# Patient Record
Sex: Male | Born: 1995 | Race: Black or African American | Hispanic: No | Marital: Single | Smoking: Never smoker
Health system: Southern US, Community
[De-identification: ages and names within clinical notes are randomized; demographics above are authoritative.]

## PROBLEM LIST (undated history)

## (undated) DIAGNOSIS — L309 Dermatitis, unspecified: Secondary | ICD-10-CM

## (undated) DIAGNOSIS — T7840XA Allergy, unspecified, initial encounter: Secondary | ICD-10-CM

## (undated) HISTORY — PX: OTHER SURGICAL HISTORY: SHX169

## (undated) HISTORY — DX: Dermatitis, unspecified: L30.9

## (undated) HISTORY — DX: Allergy, unspecified, initial encounter: T78.40XA

---
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
## 2017-01-29 ENCOUNTER — Ambulatory Visit: Payer: Self-pay | Admitting: Family Medicine

## 2017-06-24 ENCOUNTER — Encounter: Payer: Self-pay | Admitting: Emergency Medicine

## 2017-06-24 ENCOUNTER — Emergency Department
Admission: EM | Admit: 2017-06-24 | Discharge: 2017-06-24 | Disposition: A | Discharge status: Home / Self Care | Payer: Self-pay | Attending: Emergency Medicine | Admitting: Emergency Medicine

## 2017-06-24 ENCOUNTER — Other Ambulatory Visit: Payer: Self-pay

## 2017-06-24 DIAGNOSIS — Z79899 Other long term (current) drug therapy: Secondary | ICD-10-CM | POA: Insufficient documentation

## 2017-06-24 DIAGNOSIS — B9789 Other viral agents as the cause of diseases classified elsewhere: Secondary | ICD-10-CM | POA: Insufficient documentation

## 2017-06-24 DIAGNOSIS — J069 Acute upper respiratory infection, unspecified: Secondary | ICD-10-CM | POA: Insufficient documentation

## 2017-06-24 LAB — INFLUENZA PANEL BY PCR (TYPE A & B)
INFLBPCR: NEGATIVE
Influenza A By PCR: NEGATIVE

## 2017-06-24 MED ORDER — PROMETHAZINE-DM 6.25-15 MG/5ML PO SYRP: 5.0000 mL | ORAL_SOLUTION | Freq: Four times a day (QID) | ORAL | 0 refills | Status: DC | PRN

## 2017-06-24 NOTE — Discharge Instructions (Signed)
Advised Tylenol or ibuprofen for fever/body aches.

## 2017-06-24 NOTE — ED Notes (Signed)
Pt states "I think I have the flu" c/o cough, congestion, HA, N/V and fever since Sunday. States he has been taking vick's cold and flu to treat sx.

## 2017-06-24 NOTE — ED Provider Notes (Signed)
[facility] Emergency Department Provider Note   ____________________________________________   First MD Initiated Contact with Patient 06/24/17 1041     (approximate)  I have reviewed the triage vital signs and the nursing notes.   HISTORY  Chief Complaint Generalized Body Aches    HPI Mitchell Michael is a 22 y.o. male patient complain of body aches, fever, and cough for 3 days.  Patient state that was vomiting yesterday none today.  Patient has not taken a flu shot for this season.  Patient rates his pain discomfort is 8/10.  Patient describes his pain is "aching".  No palates measured for complaint.  Past Medical History:  Diagnosis Date  . Allergy   . Eczema     Patient Active Problem List   Diagnosis Date Noted  . History of anemia 08/25/2015  . Allergy   . Eczema     Past Surgical History:  Procedure Laterality Date  . cyst removed Left     Prior to Admission medications   Medication Sig Start Date End Date Taking? Authorizing Provider  fluticasone (FLONASE) 50 MCG/ACT nasal spray Place 2 sprays into both nostrils daily. 06/29/16   Menshew, Charlesetta Ivory, PA-C  naproxen (NAPROSYN) 500 MG tablet Take 1 tablet (500 mg total) by mouth 2 (two) times daily with a meal. 07/31/16   Particia Nearing, PA-C  promethazine-dextromethorphan (PROMETHAZINE-DM) 6.25-15 MG/5ML syrup Take 5 mLs by mouth 4 (four) times daily as needed for cough. 06/24/17   Joni Reining, PA-C    Allergies Patient has no known allergies.  Family History  Problem Relation Age of Onset  . Hypertension Mother   . Hypertension Sister   . Hypertension Maternal Grandmother   . Heart murmur Maternal Grandmother   . Diabetes Maternal Grandfather   . Hypertension Maternal Grandfather   . Thyroid disease Maternal Grandfather   . Cancer Maternal Grandfather        Brain Tumor    Social History Social History   Tobacco Use  . Smoking status: Never Smoker  .  Smokeless tobacco: Never Used  Substance Use Topics  . Alcohol use: No  . Drug use: No    Review of Systems Constitutional: No fever/chills Eyes: No visual changes. ENT: No sore throat.  Nasal congestion and runny nose. Cardiovascular: Denies chest pain. Respiratory: Denies shortness of breath. Gastrointestinal: No abdominal pain.  Resolved nausea and vomiting. no diarrhea.  No constipation. Genitourinary: Negative for dysuria. Musculoskeletal: Negative for back pain. Skin: Negative for rash. Neurological: Negative for headaches, focal weakness or numbness.   ____________________________________________   PHYSICAL EXAM:  VITAL SIGNS: ED Triage Vitals  Enc Vitals Group     BP 06/24/17 0947 (!) 126/96     Pulse Rate 06/24/17 0947 67     Resp 06/24/17 0947 20     Temp 06/24/17 0947 98.9 F (37.2 C)     Temp Source 06/24/17 0947 Oral     SpO2 06/24/17 0947 99 %     Weight 06/24/17 0949 175 lb (79.4 kg)     Height 06/24/17 0949 5\' 7"  (1.702 m)     Head Circumference --      Peak Flow --      Pain Score 06/24/17 0953 8     Pain Loc --      Pain Edu? --      Excl. in GC? --    Constitutional: Alert and oriented. Well appearing and in no acute distress. Nose: Edematous  nasal turbinates clear rhinorrhea. mouth/Throat: Mucous membranes are moist.  Oropharynx non-erythematous.  Nasal drainage Neck: No stridor.  Cardiovascular: Normal rate, regular rhythm. Grossly normal heart sounds.  Good peripheral circulation. Respiratory: Normal respiratory effort.  No retractions. Lungs CTAB. Gastrointestinal: Soft and nontender. No distention. No abdominal bruits. No CVA tenderness. Musculoskeletal: No lower extremity tenderness nor edema.  No joint effusions. Neurologic:  Normal speech and language. No gross focal neurologic deficits are appreciated. No gait instability. Skin:  Skin is warm, dry and intact. No rash noted. Psychiatric: Mood and affect are normal. Speech and behavior  are normal.  ____________________________________________   LABS (all labs ordered are listed, but only abnormal results are displayed)  Labs Reviewed  INFLUENZA PANEL BY PCR (TYPE A & B)   ____________________________________________  EKG   ____________________________________________  RADIOLOGY  ED MD interpretation:    Official radiology report(s): No results found.  ____________________________________________   PROCEDURES  Procedure(s) performed: None  Procedures  Critical Care performed: No  ____________________________________________   INITIAL IMPRESSION / ASSESSMENT AND PLAN / ED COURSE  As part of my medical decision making, I reviewed the following data within the electronic MEDICAL RECORD NUMBER   Viral respiratory infection.  Discussed negative flu results with patient.  Patient given discharge care instruction advised take medication as directed.  [facility]      ____________________________________________   FINAL CLINICAL IMPRESSION(S) / ED DIAGNOSES  Final diagnoses:  Viral upper respiratory tract infection     ED Discharge Orders        Ordered    promethazine-dextromethorphan (PROMETHAZINE-DM) 6.25-15 MG/5ML syrup  4 times daily PRN     06/24/17 1108       Note:  This document was prepared using Dragon voice recognition software and may include unintentional dictation errors.    Joni ReiningSmith, Ronald K, PA-C 06/24/17 1113    Emily FilbertWilliams, Jonathan E, MD 06/24/17 [phone]

## 2017-06-24 NOTE — ED Triage Notes (Signed)
Presents with body aches ,fever and cough since Sunday pm

## 2017-12-04 ENCOUNTER — Emergency Department
Admission: EM | Admit: 2017-12-04 | Discharge: 2017-12-04 | Disposition: A | Discharge status: Home / Self Care | Payer: Self-pay | Attending: Emergency Medicine | Admitting: Emergency Medicine

## 2017-12-04 ENCOUNTER — Other Ambulatory Visit: Payer: Self-pay

## 2017-12-04 DIAGNOSIS — B9789 Other viral agents as the cause of diseases classified elsewhere: Secondary | ICD-10-CM | POA: Insufficient documentation

## 2017-12-04 DIAGNOSIS — J069 Acute upper respiratory infection, unspecified: Secondary | ICD-10-CM | POA: Insufficient documentation

## 2017-12-04 DIAGNOSIS — A084 Viral intestinal infection, unspecified: Secondary | ICD-10-CM | POA: Insufficient documentation

## 2017-12-04 MED ORDER — PSEUDOEPH-BROMPHEN-DM 30-2-10 MG/5ML PO SYRP: 5.0000 mL | ORAL_SOLUTION | Freq: Four times a day (QID) | ORAL | 0 refills | Status: DC | PRN

## 2017-12-04 MED ORDER — ONDANSETRON 4 MG PO TBDP: 4.0000 mg | ORAL_TABLET | Freq: Three times a day (TID) | ORAL | 0 refills | Status: DC | PRN

## 2017-12-04 NOTE — ED Provider Notes (Signed)
[facility] Emergency Department Provider Note  ____________________________________________   First MD Initiated Contact with Patient 12/04/17 1357     (approximate)  I have reviewed the triage vital signs and the nursing notes.   HISTORY  Chief Complaint Cough and Sore Throat  HPI Mitchell Michael is a 22 y.o. male is here with complaint of cough, sore throat and headache for 1 week.  Patient states that his temperature was 99.8 at home.  He denies any knowledge of exposure to strep throat.  No one else in the family currently is sick.  Patient also complains of vomiting and diarrhea.  This just recently started.  He rates his pain as an 8 out of 10.   Past Medical History:  Diagnosis Date  . Allergy   . Eczema     Patient Active Problem List   Diagnosis Date Noted  . History of anemia 08/25/2015  . Allergy   . Eczema     Past Surgical History:  Procedure Laterality Date  . cyst removed Left     Prior to Admission medications   Medication Sig Start Date End Date Taking? Authorizing Provider  brompheniramine-pseudoephedrine-DM 30-2-10 MG/5ML syrup Take 5 mLs by mouth 4 (four) times daily as needed. 12/04/17   Tommi Rumps, PA-C  ondansetron (ZOFRAN ODT) 4 MG disintegrating tablet Take 1 tablet (4 mg total) by mouth every 8 (eight) hours as needed for nausea or vomiting. 12/04/17   Tommi Rumps, PA-C    Allergies Patient has no known allergies.  Family History  Problem Relation Age of Onset  . Hypertension Mother   . Hypertension Sister   . Hypertension Maternal Grandmother   . Heart murmur Maternal Grandmother   . Diabetes Maternal Grandfather   . Hypertension Maternal Grandfather   . Thyroid disease Maternal Grandfather   . Cancer Maternal Grandfather        Brain Tumor    Social History Social History   Tobacco Use  . Smoking status: Never Smoker  . Smokeless tobacco: Never Used  Substance Use Topics  . Alcohol use: No   . Drug use: No    Review of Systems Constitutional: Positive fever/chills Eyes: No visual changes. ENT: Positive sore throat. Cardiovascular: Denies chest pain. Respiratory: Denies shortness of breath.  Positive for nonproductive cough. Gastrointestinal: No abdominal pain.  Positive nausea, give vomiting.  Positive diarrhea.   Musculoskeletal: Negative for muscle aches. Skin: Negative for rash. Neurological: Negative for headaches, focal weakness or numbness. ____________________________________________   PHYSICAL EXAM:  VITAL SIGNS: ED Triage Vitals  Enc Vitals Group     BP 12/04/17 1329 114/77     Pulse Rate 12/04/17 1329 66     Resp 12/04/17 1328 16     Temp 12/04/17 1328 99.1 F (37.3 C)     Temp Source 12/04/17 1328 Oral     SpO2 12/04/17 1329 100 %     Weight 12/04/17 1329 165 lb (74.8 kg)     Height 12/04/17 1329 5\' 7"  (1.702 m)     Head Circumference --      Peak Flow --      Pain Score 12/04/17 1328 8     Pain Loc --      Pain Edu? --      Excl. in GC? --    Constitutional: Alert and oriented. Well appearing and in no acute distress.  Currently patient is lying on the stretcher eating a bag of white cheddar popcorn and  drinking a drink.  He does not appear to be in any acute distress. Eyes: Conjunctivae are normal.  Head: Atraumatic. Nose: No congestion/rhinnorhea.  TMs are dull bilaterally but no erythema or injection is noted. Mouth/Throat: Mucous membranes are moist.  Oropharynx non-erythematous.  Mild posterior drainage noted.  No exudate and uvula is midline. Neck: No stridor.   Hematological/Lymphatic/Immunilogical: No cervical lymphadenopathy. Cardiovascular: Normal rate, regular rhythm. Grossly normal heart sounds.  Good peripheral circulation. Respiratory: Normal respiratory effort.  No retractions. Lungs CTAB. Gastrointestinal: Soft and nontender. No distention. Musculoskeletal: Moves upper and lower extremities without any difficulty.  Normal gait  was noted. Neurologic:  Normal speech and language. No gross focal neurologic deficits are appreciated. No gait instability. Skin:  Skin is warm, dry and intact. No rash noted. Psychiatric: Mood and affect are normal. Speech and behavior are normal.  ____________________________________________   LABS (all labs ordered are listed, but only abnormal results are displayed)  Labs Reviewed - No data to display   PROCEDURES  Procedure(s) performed: None  Procedures  Critical Care performed: No  ____________________________________________   INITIAL IMPRESSION / ASSESSMENT AND PLAN / ED COURSE  As part of my medical decision making, I reviewed the following data within the electronic MEDICAL RECORD NUMBER Notes from prior ED visits and [place] Controlled Substance Database  Patient was given prescription for Bromfed-DM as needed for cough and congestion.  He was instructed to refrain from eating food for the next 24 hours and stay on clear liquids.  The popcorn that he ate while in the ED most likely will result in more diarrhea which he is aware of.  He was given a prescription for Zofran ODT 4 mg 1 every 8 hours as needed for nausea.  Tylenol if needed for fever.  Mother was made aware that this is contagious as we have been seeing many people with it.  He will follow-up with his PCP if any continued problems.   ____________________________________________   FINAL CLINICAL IMPRESSION(S) / ED DIAGNOSES  Final diagnoses:  Viral upper respiratory tract infection  Viral gastroenteritis     ED Discharge Orders        Ordered    brompheniramine-pseudoephedrine-DM 30-2-10 MG/5ML syrup  4 times daily PRN     12/04/17 1444    ondansetron (ZOFRAN ODT) 4 MG disintegrating tablet  Every 8 hours PRN     12/04/17 1444       Note:  This document was prepared using Dragon voice recognition software and may include unintentional dictation errors.    Tommi RumpsSummers, Ashla Murph L, PA-C 12/04/17 1604      Jene EveryKinner, Robert, MD 12/09/17 0700

## 2017-12-04 NOTE — ED Notes (Addendum)
See triage note.  Presents with 1 week hx of cough,headache and fever  Low grade fever noted on arrival..per mother he has had fever of 103 at home also having some discomfort in chest with inspiration

## 2017-12-04 NOTE — ED Triage Notes (Signed)
Pt states cough, sore throat, and HA x week. States temp of 99.8 at home. Alert, oriented. Ambulatory. No distress noted. Pt drinking in triage.

## 2017-12-04 NOTE — Discharge Instructions (Addendum)
Follow up with your primary care provider if any continued problems.  A prescription for Bromfed-DM is for congestion and cough.  Zofran ODT is one on your tongue every 8 hours if needed for nausea or vomiting.  Remain on clear liquids only for at least the next 24 hours.  This will help with nausea and also correct your diarrhea.  Tylenol if needed for fever.

## 2018-10-14 DIAGNOSIS — H5213 Myopia, bilateral: Secondary | ICD-10-CM | POA: Diagnosis not present

## 2018-11-25 DIAGNOSIS — H52221 Regular astigmatism, right eye: Secondary | ICD-10-CM | POA: Diagnosis not present

## 2019-10-01 DIAGNOSIS — M25562 Pain in left knee: Secondary | ICD-10-CM | POA: Diagnosis not present

## 2019-10-01 DIAGNOSIS — M25561 Pain in right knee: Secondary | ICD-10-CM | POA: Diagnosis not present

## 2020-01-21 ENCOUNTER — Ambulatory Visit
Admission: RE | Admit: 2020-01-21 | Discharge: 2020-01-21 | Disposition: A | Discharge status: Home / Self Care | Payer: Medicaid Other | Source: Ambulatory Visit | Attending: Family Medicine | Admitting: Family Medicine

## 2020-01-21 ENCOUNTER — Other Ambulatory Visit: Payer: Self-pay

## 2020-01-21 VITALS — BP 138/90 | HR 68 | Temp 98.8°F | Resp 16

## 2020-01-21 DIAGNOSIS — R519 Headache, unspecified: Secondary | ICD-10-CM

## 2020-01-21 DIAGNOSIS — R0981 Nasal congestion: Secondary | ICD-10-CM

## 2020-01-21 DIAGNOSIS — R9081 Abnormal echoencephalogram: Secondary | ICD-10-CM | POA: Diagnosis not present

## 2020-01-21 DIAGNOSIS — Z1152 Encounter for screening for COVID-19: Secondary | ICD-10-CM

## 2020-01-21 DIAGNOSIS — B349 Viral infection, unspecified: Secondary | ICD-10-CM | POA: Diagnosis not present

## 2020-01-21 DIAGNOSIS — R509 Fever, unspecified: Secondary | ICD-10-CM | POA: Diagnosis not present

## 2020-01-21 DIAGNOSIS — R059 Cough, unspecified: Secondary | ICD-10-CM

## 2020-01-21 DIAGNOSIS — R05 Cough: Secondary | ICD-10-CM | POA: Diagnosis not present

## 2020-01-21 MED ORDER — FLUTICASONE PROPIONATE 50 MCG/ACT NA SUSP: 2.0000 | Freq: Every day | NASAL | 2 refills | Status: AC

## 2020-01-21 MED ORDER — CETIRIZINE-PSEUDOEPHEDRINE ER 5-120 MG PO TB12: 1.0000 | ORAL_TABLET | Freq: Every day | ORAL | 0 refills | Status: DC

## 2020-01-21 MED ORDER — BENZONATATE 100 MG PO CAPS: 100.0000 mg | ORAL_CAPSULE | Freq: Three times a day (TID) | ORAL | 0 refills | Status: DC

## 2020-01-21 NOTE — ED Provider Notes (Signed)
[facility]   [id number] 01/21/20 Arrival Time: 0951   CC: COVID symptoms  SUBJECTIVE: History from: patient.  Kaleel Wuthrich is a 24 y.o. male who presents with abrupt onset of nasal congestion, PND, fever and persistent dry cough for 2 days. Does not have significant medical history. Denies sick exposure to COVID, flu or strep. Denies recent travel. Has negative history of Covid. Has not completed Covid vaccines. Has taken OTC medications for this with temporary relief. There are no aggravating or alleviating factors. Denies previous symptoms in the past. Denies chills, fatigue, sinus pain, rhinorrhea, sore throat, SOB, wheezing, chest pain, nausea, changes in bowel or bladder habits.    ROS: As per HPI.  All other pertinent ROS negative.     Past Medical History:  Diagnosis Date  . Allergy   . Eczema    Past Surgical History:  Procedure Laterality Date  . cyst removed Left    No Known Allergies No current facility-administered medications on file prior to encounter.   No current outpatient medications on file prior to encounter.   Social History   Socioeconomic History  . Marital status: Single    Spouse name: Not on file  . Number of children: Not on file  . Years of education: Not on file  . Highest education level: Not on file  Occupational History  . Not on file  Tobacco Use  . Smoking status: Never Smoker  . Smokeless tobacco: Never Used  Vaping Use  . Vaping Use: Some days  . Substances: Flavoring  Substance and Sexual Activity  . Alcohol use: No  . Drug use: No  . Sexual activity: Not on file  Other Topics Concern  . Not on file  Social History Narrative  . Not on file   Social Determinants of Health   Financial Resource Strain:   . Difficulty of Paying Living Expenses: Not on file  Food Insecurity:   . Worried About Programme researcher, broadcasting/film/video in the Last Year: Not on file  . Ran Out of Food in the Last Year: Not on file  Transportation Needs:     . Lack of Transportation (Medical): Not on file  . Lack of Transportation (Non-Medical): Not on file  Physical Activity:   . Days of Exercise per Week: Not on file  . Minutes of Exercise per Session: Not on file  Stress:   . Feeling of Stress : Not on file  Social Connections:   . Frequency of Communication with Friends and Family: Not on file  . Frequency of Social Gatherings with Friends and Family: Not on file  . Attends Religious Services: Not on file  . Active Member of Clubs or Organizations: Not on file  . Attends Banker Meetings: Not on file  . Marital Status: Not on file  Intimate Partner Violence:   . Fear of Current or Ex-Partner: Not on file  . Emotionally Abused: Not on file  . Physically Abused: Not on file  . Sexually Abused: Not on file   Family History  Problem Relation Age of Onset  . Hypertension Mother   . Hypertension Sister   . Hypertension Maternal Grandmother   . Heart murmur Maternal Grandmother   . Diabetes Maternal Grandfather   . Hypertension Maternal Grandfather   . Thyroid disease Maternal Grandfather   . Cancer Maternal Grandfather        Brain Tumor    OBJECTIVE:  Vitals:   01/21/20 1009  BP: 138/90  Pulse: 68  Resp: 16  Temp: 98.8 F (37.1 C)  TempSrc: Oral  SpO2: 98%     General appearance: alert; appears fatigued, but nontoxic; speaking in full sentences and tolerating own secretions, afebrile HEENT: NCAT; Ears: EACs clear, TMs pearly gray; Eyes: PERRL.  EOM grossly intact. Sinuses: nontender; Nose: nares patent without rhinorrhea, Throat: oropharynx clear, tonsils non erythematous or enlarged, uvula midline  Neck: supple without LAD Lungs: unlabored respirations, symmetrical air entry; cough: absent; no respiratory distress; CTAB Heart: regular rate and rhythm.  Radial pulses 2+ symmetrical bilaterally Skin: warm and dry Psychological: alert and cooperative; normal mood and affect  LABS:  No results found for  this or any previous visit (from the past 24 hour(s)).   ASSESSMENT & PLAN:  1. Viral illness   2. Encounter for screening for COVID-19   3. Nonintractable headache, unspecified chronicity pattern, unspecified headache type   4. Fever, unspecified fever cause   5. Nasal congestion   6. Cough     Meds ordered this encounter  Medications  . cetirizine-pseudoephedrine (ZYRTEC-D) 5-120 MG tablet    Sig: Take 1 tablet by mouth daily.    Dispense:  30 tablet    Refill:  0    Order Specific Question:   Supervising Provider    Answer:   Merrilee Jansky [id number]  . fluticasone (FLONASE) 50 MCG/ACT nasal spray    Sig: Place 2 sprays into both nostrils daily.    Dispense:  9.9 mL    Refill:  2    Order Specific Question:   Supervising Provider    Answer:   Merrilee Jansky [id number]  . benzonatate (TESSALON) 100 MG capsule    Sig: Take 1 capsule (100 mg total) by mouth every 8 (eight) hours.    Dispense:  21 capsule    Refill:  0    Order Specific Question:   Supervising Provider    Answer:   Merrilee Jansky [id number]   Prescribed zyrtec D Prescribed flonase Prescribed tessalon perles   COVID testing ordered.  It will take between 1-2 days for test results.  Someone will contact you regarding abnormal results.    Patient should remain in quarantine until they have received Covid results.  If negative you may resume normal activities (go back to work/school) while practicing hand hygiene, social distance, and mask wearing.  If positive, patient should remain in quarantine for 10 days from symptom onset AND greater than 72 hours after symptoms resolution (absence of fever without the use of fever-reducing medication and improvement in respiratory symptoms), whichever is longer Get plenty of rest and push fluids Use medications daily for symptom relief Use OTC medications like ibuprofen or tylenol as needed fever or pain Call or go to the ED if you have any new or worsening  symptoms such as fever, worsening cough, shortness of breath, chest tightness, chest pain, turning blue, changes in mental status.  Reviewed expectations re: course of current medical issues. Questions answered. Outlined signs and symptoms indicating need for more acute intervention. Patient verbalized understanding. After Visit Summary given.         Moshe Cipro, NP 01/21/20 1038

## 2020-01-21 NOTE — ED Triage Notes (Signed)
Pt presents with  HA, cough, temp up to 100.9 at home x 2 days.  Is taking OTC meds, cold and flu meds, vitamin C, elderberry gummies and Tylenol/Ibuprofen with some relief.   Has not received COVID vaccine.

## 2020-01-21 NOTE — Discharge Instructions (Signed)
Your COVID test is pending.  You should self quarantine until the test result is back.    Take Tylenol as needed for fever or discomfort.  Rest and keep yourself hydrated.    Go to the emergency department if you develop acute worsening symptoms.     

## 2020-01-22 LAB — NOVEL CORONAVIRUS, NAA: SARS-CoV-2, NAA: DETECTED — AB

## 2020-05-07 ENCOUNTER — Other Ambulatory Visit: Payer: Self-pay

## 2020-05-07 ENCOUNTER — Ambulatory Visit
Admission: EM | Admit: 2020-05-07 | Discharge: 2020-05-07 | Disposition: A | Discharge status: Home / Self Care | Payer: Medicaid Other | Attending: Family Medicine | Admitting: Family Medicine

## 2020-05-07 ENCOUNTER — Encounter: Payer: Self-pay | Admitting: Emergency Medicine

## 2020-05-07 DIAGNOSIS — Z20822 Contact with and (suspected) exposure to covid-19: Secondary | ICD-10-CM | POA: Insufficient documentation

## 2020-05-07 DIAGNOSIS — Z1152 Encounter for screening for COVID-19: Secondary | ICD-10-CM

## 2020-05-07 NOTE — ED Triage Notes (Signed)
Patient here for COVID test.  Patient states that his wife tested positive for Covid today at [facility].  Patient denies symptoms.

## 2020-05-07 NOTE — Discharge Instructions (Signed)

## 2020-05-08 LAB — SARS CORONAVIRUS 2 (TAT 6-24 HRS): SARS Coronavirus 2: NEGATIVE

## 2020-09-12 ENCOUNTER — Other Ambulatory Visit: Payer: Self-pay

## 2020-09-12 ENCOUNTER — Encounter: Payer: Self-pay | Admitting: Internal Medicine

## 2020-09-12 ENCOUNTER — Ambulatory Visit (INDEPENDENT_AMBULATORY_CARE_PROVIDER_SITE_OTHER): Payer: Medicaid Other | Admitting: Internal Medicine

## 2020-09-12 VITALS — Ht 66.0 in | Wt 185.0 lb

## 2020-09-12 DIAGNOSIS — R6889 Other general symptoms and signs: Secondary | ICD-10-CM | POA: Diagnosis not present

## 2020-09-12 DIAGNOSIS — Z1152 Encounter for screening for COVID-19: Secondary | ICD-10-CM | POA: Diagnosis not present

## 2020-09-12 LAB — VERITOR FLU A/B WAIVED
Influenza A: NEGATIVE
Influenza B: NEGATIVE

## 2020-09-12 MED ORDER — PROBIOTIC (LACTOBACILLUS) PO CAPS
1.0000 | ORAL_CAPSULE | ORAL | 1 refills | Status: AC | PRN
Start: 2020-09-12 — End: 2020-09-17

## 2020-09-12 MED ORDER — ONDANSETRON HCL 8 MG PO TABS: 8.0000 mg | ORAL_TABLET | Freq: Three times a day (TID) | ORAL | 0 refills | Status: DC | PRN

## 2020-09-12 NOTE — Progress Notes (Signed)
Ht 5\' 6"  (1.676 m)   Wt 185 lb (83.9 kg)   BMI 29.86 kg/m    Subjective:    Patient ID: , male    DOB: [birth year], 25 y.o.   MRN: [id number]  HPI: Mitchell Michael is a 25 y.o. male  I connected with [place] on 09/12/20 by a video enabled telemedicine application and verified that I am speaking with the correct person using two identifiers.  I discussed the limitations of evaluation and management by telemedicine. The patient expressed understanding and agreed to proceed. "I discussed the limitations of evaluation and management by telemedicine and the availability of in person appointments. The patient expressed understanding and agreed to proceed"     This visit was completed via MyChart due to the restrictions of the COVID-19 pandemic. All issues as above were discussed and addressed. Physical exam was done as above through visual confirmation on MyChart. If it was felt that the patient should be evaluated in the office, they were directed there. The patient verbally consented to this visit. Location of the patient: home Location of the provider: work Those involved with this call:  Provider: 09/14/20, MD CMA: Loura Pardon, CMA Front Desk/Registration: Wilhemena Durie    Diarrhea  This is a new (D x 2 today at work, last night had two  loose stools.  fatigued +) problem. The stool consistency is described as mucous and watery. The patient states that diarrhea does not awaken him from sleep. Associated symptoms include abdominal pain and myalgias. Pertinent negatives include no arthralgias, bloating, chills, coughing, fever, headaches, increased  flatus, sweats, URI, vomiting or weight loss. Associated symptoms comments: Has some nausea and vomiting x 3 times. . Exacerbated by: bojangles yesterday his girlfriend has the same symptoms.    No chief complaint on file.   Relevant past medical, surgical, family and social history reviewed and updated as  indicated. Interim medical history since our last visit reviewed. Allergies and medications reviewed and updated.  Review of Systems  Constitutional: Negative for chills, fever and weight loss.  Respiratory: Negative for cough.   Gastrointestinal: Positive for abdominal pain and diarrhea. Negative for bloating, flatus and vomiting.  Musculoskeletal: Positive for myalgias. Negative for arthralgias.  Neurological: Negative for headaches.    Per HPI unless specifically indicated above     Objective:    Ht 5\' 6"  (1.676 m)   Wt 185 lb (83.9 kg)   BMI 29.86 kg/m   Wt Readings from Last 3 Encounters:  09/12/20 185 lb (83.9 kg)  05/07/20 185 lb (83.9 kg)  12/04/17 165 lb (74.8 kg) (64 %, Z= 0.37)*   * Growth percentiles are based on CDC (Boys, 2-20 Years) data.    Physical Exam Vitals reviewed: not performed sec to virtual visit.     Results for orders placed or performed during the hospital encounter of 05/07/20  SARS CORONAVIRUS 2 (TAT 6-24 HRS) Nasopharyngeal Nasopharyngeal Swab   Specimen: Nasopharyngeal Swab  Result Value Ref Range   SARS Coronavirus 2 NEGATIVE NEGATIVE        Current Outpatient Medications:  .  benzonatate (TESSALON) 100 MG capsule, Take 1 capsule (100 mg total) by mouth every 8 (eight) hours., Disp: 21 capsule, Rfl: 0 .  cetirizine-pseudoephedrine (ZYRTEC-D) 5-120 MG tablet, Take 1 tablet by mouth daily., Disp: 30 tablet, Rfl: 0 .  fluticasone (FLONASE) 50 MCG/ACT nasal spray, Place 2 sprays into both nostrils daily., Disp: 9.9 mL, Rfl: 2    Assessment & Plan:  Acute Diarrhea ? Sec to viral Gastroenteritis will need to check for COVID and FLU . Will need to use probiotics after every loose stool Consider imodium  Pt advised a bland BRAT diet today.  Advised to call the office or go to the ER if she develops further abdominal pain crampign, any new onset of bleeding / black stools or  fresh red blood from any orifice,.  Pt verbalized understanding of  such. diarrhea    Problem List Items Addressed This Visit   None      Follow up plan: No follow-ups on file.

## 2020-09-13 NOTE — Progress Notes (Signed)
Please let pt know this was normal.

## 2020-09-16 LAB — SPECIMEN STATUS REPORT

## 2020-09-19 LAB — SARS-COV-2, NAA 2 DAY TAT

## 2020-09-19 LAB — NOVEL CORONAVIRUS, NAA: SARS-CoV-2, NAA: NOT DETECTED

## 2021-08-26 ENCOUNTER — Emergency Department: Payer: Medicaid Other

## 2021-08-26 ENCOUNTER — Other Ambulatory Visit: Payer: Self-pay

## 2021-08-26 ENCOUNTER — Emergency Department
Admission: EM | Admit: 2021-08-26 | Discharge: 2021-08-27 | Disposition: A | Discharge status: Home / Self Care | Payer: Medicaid Other | Attending: Emergency Medicine | Admitting: Emergency Medicine

## 2021-08-26 DIAGNOSIS — R1110 Vomiting, unspecified: Secondary | ICD-10-CM | POA: Diagnosis not present

## 2021-08-26 DIAGNOSIS — K529 Noninfective gastroenteritis and colitis, unspecified: Secondary | ICD-10-CM | POA: Insufficient documentation

## 2021-08-26 DIAGNOSIS — D72829 Elevated white blood cell count, unspecified: Secondary | ICD-10-CM | POA: Diagnosis not present

## 2021-08-26 DIAGNOSIS — R112 Nausea with vomiting, unspecified: Secondary | ICD-10-CM | POA: Diagnosis not present

## 2021-08-26 DIAGNOSIS — R109 Unspecified abdominal pain: Secondary | ICD-10-CM | POA: Diagnosis not present

## 2021-08-26 DIAGNOSIS — R0789 Other chest pain: Secondary | ICD-10-CM | POA: Diagnosis not present

## 2021-08-26 LAB — URINALYSIS, ROUTINE W REFLEX MICROSCOPIC
Bilirubin Urine: NEGATIVE
Glucose, UA: NEGATIVE mg/dL
Hgb urine dipstick: NEGATIVE
Ketones, ur: NEGATIVE mg/dL
Leukocytes,Ua: NEGATIVE
Nitrite: NEGATIVE
Protein, ur: NEGATIVE mg/dL
Specific Gravity, Urine: 1.017 (ref 1.005–1.030)
pH: 6 (ref 5.0–8.0)

## 2021-08-26 LAB — COMPREHENSIVE METABOLIC PANEL
ALT: 29 U/L (ref 0–44)
AST: 22 U/L (ref 15–41)
Albumin: 4.6 g/dL (ref 3.5–5.0)
Alkaline Phosphatase: 77 U/L (ref 38–126)
Anion gap: 7 (ref 5–15)
BUN: 10 mg/dL (ref 6–20)
CO2: 27 mmol/L (ref 22–32)
Calcium: 9.3 mg/dL (ref 8.9–10.3)
Chloride: 104 mmol/L (ref 98–111)
Creatinine, Ser: 1.04 mg/dL (ref 0.61–1.24)
GFR, Estimated: >60 mL/min (ref >60)
Glucose, Bld: 96 mg/dL (ref 70–99)
Potassium: 3.4 mmol/L — ABNORMAL LOW (ref 3.5–5.1)
Sodium: 138 mmol/L (ref 135–145)
Total Bilirubin: 1 mg/dL (ref 0.3–1.2)
Total Protein: 8.5 g/dL — ABNORMAL HIGH (ref 6.5–8.1)

## 2021-08-26 LAB — CBC
HCT: 44.2 % (ref 39.0–52.0)
Hemoglobin: 14.6 g/dL (ref 13.0–17.0)
MCH: 29 pg (ref 26.0–34.0)
MCHC: 33 g/dL (ref 30.0–36.0)
MCV: 87.7 fL (ref 80.0–100.0)
Platelets: 301 10*3/uL (ref 150–400)
RBC: 5.04 MIL/uL (ref 4.22–5.81)
RDW: 11.9 % (ref 11.5–15.5)
WBC: 22.7 10*3/uL — ABNORMAL HIGH (ref 4.0–10.5)
nRBC: 0 % (ref 0.0–0.2)

## 2021-08-26 LAB — LIPASE, BLOOD: Lipase: 27 U/L (ref 11–51)

## 2021-08-26 MED ORDER — ONDANSETRON HCL 4 MG/2ML IJ SOLN
4.0000 mg | Freq: Once | INTRAMUSCULAR | Status: AC
  Administered 2021-08-26: 4 mg via INTRAVENOUS
  Filled 2021-08-26: qty 2

## 2021-08-26 MED ORDER — ONDANSETRON HCL 4 MG PO TABS: 4.0000 mg | ORAL_TABLET | Freq: Three times a day (TID) | ORAL | 0 refills | Status: DC | PRN

## 2021-08-26 MED ORDER — SODIUM CHLORIDE 0.9 % IV BOLUS
1000.0000 mL | Freq: Once | INTRAVENOUS | Status: AC
  Administered 2021-08-26: 1000 mL via INTRAVENOUS

## 2021-08-26 MED ORDER — IOHEXOL 300 MG/ML  SOLN
100.0000 mL | Freq: Once | INTRAMUSCULAR | Status: AC | PRN
  Administered 2021-08-26: 100 mL via INTRAVENOUS

## 2021-08-26 NOTE — ED Provider Notes (Signed)
? ?[facility] ?Provider Note ? ? ? Event Date/Time  ? First MD Initiated Contact with Patient 08/26/21 2202   ?  (approximate) ? ? ?History  ? ?Emesis and Abdominal Pain ? ? ?HPI ? ?Mitchell Michael is a 26 y.o. male  who presents to the emergency department today because of concerns for nausea vomiting and diarrhea.  Patient states that the symptoms started this morning when he woke up.  He denies any unusual ingestions yesterday.  Felt in his normal state of health yesterday.  The vomiting and diarrhea has not been bloody.  It has been accompanied by some abdominal pain located across the upper part of his abdomen.  No measured fevers although he felt hot and has had some sweating today.  Denies similar symptoms in the past. ? ?Physical Exam  ? ?Triage Vital Signs: ?ED Triage Vitals  ?Enc Vitals Group  ?   BP 08/26/21 2032 139/84  ?   Pulse Rate 08/26/21 2032 81  ?   Resp 08/26/21 2032 18  ?   Temp 08/26/21 2032 98.8 ?F (37.1 ?C)  ?   Temp Source 08/26/21 2032 Oral  ?   SpO2 08/26/21 2032 100 %  ?   Weight --   ?   Height --   ?   Head Circumference --   ?   Peak Flow --   ?   Pain Score 08/26/21 2037 8  ? ?Most recent vital signs: ?Vitals:  ? 08/26/21 2032  ?BP: 139/84  ?Pulse: 81  ?Resp: 18  ?Temp: 98.8 ?F (37.1 ?C)  ?SpO2: 100%  ? ?General: Awake, no distress.  ?CV:  Good peripheral perfusion. Regular rate and rhythm. ?Resp:  Normal effort. Lungs clear to auscultation ?Abd:  No distention. Minimally tender across the upper abdomen.  ? ? ?ED Results / Procedures / Treatments  ? ?Labs ?(all labs ordered are listed, but only abnormal results are displayed) ?Labs Reviewed  ?COMPREHENSIVE METABOLIC PANEL - Abnormal; Notable for the following components:  ?    Result Value  ? Potassium 3.4 (*)   ? Total Protein 8.5 (*)   ? All other components within normal limits  ?CBC - Abnormal; Notable for the following components:  ? WBC 22.7 (*)   ? All other components within normal limits  ?URINALYSIS,  ROUTINE W REFLEX MICROSCOPIC - Abnormal; Notable for the following components:  ? Color, Urine YELLOW (*)   ? APPearance CLEAR (*)   ? All other components within normal limits  ?LIPASE, BLOOD  ? ? ? ?EKG ? ?None ? ? ?RADIOLOGY ?I independently interpreted and visualized the ct ab/pel. My interpretation: No free air ?Radiology interpretation: IMPRESSION: ?1. CT findings of gastroenteritis without evidence of bowel ?obstruction, mesenteric inflammatory change or mesenteric ?adenopathy. ?2. The bladder contracted and not well seen. Bladder thickening not ?strictly excluded. Correlate with urinalysis if appropriate. ?3. 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 ?4. Umbilical fat hernia.  ? ? ? ?PROCEDURES: ? ?Critical Care performed: No ? ?Procedures ? ? ?MEDICATIONS ORDERED IN ED: ?Medications - No data to display ? ? ?IMPRESSION / MDM / ASSESSMENT AND PLAN / ED COURSE  ?I reviewed the triage vital signs and the nursing notes. ?             ?               ? ?Differential diagnosis includes, but is not limited to, gastroenteritis, appendicitis, cholecystosis. ? ?  Patient presented to the emergency department today because of concerns for nausea vomiting and diarrhea.  Symptoms started this morning.  On exam patient did have some tenderness in the upper abdomen.  Blood work was notable for leukocytosis.  CT abdomen pelvis was obtained which is consistent with gastroenteritis.  Discussed this finding with the patient.  Patient was given IV fluids and nausea medication here in the emergency department.  If patient feels improvement do feel it would be reasonable for patient to be discharged home. ? ? ?FINAL CLINICAL IMPRESSION(S) / ED DIAGNOSES  ? ?Final diagnoses:  ?Gastroenteritis  ? ? ? ? ? ?Note:  This document was prepared using Dragon voice recognition software and may include unintentional dictation errors. ? ?  ?Phineas Semen, MD ?08/26/21 2330 ? ?

## 2021-08-26 NOTE — ED Notes (Signed)
Pt to CT at this time.

## 2021-08-26 NOTE — ED Triage Notes (Signed)
Pt comes pov with abd pain, emesis since this morning. No fevers. Unsure if he ate something bad.  ?

## 2021-08-26 NOTE — Discharge Instructions (Addendum)
Please seek medical attention for any high fevers, chest pain, shortness of breath, change in behavior, persistent vomiting, bloody stool or any other new or concerning symptoms.  

## 2021-08-27 NOTE — ED Notes (Addendum)
Pt verbalized understanding of discharge instructions, prescriptions, and follow-up care instructions. Pt advised if symptoms worsen to return to ED. E-signature not available due to e-signature pad not working.  

## 2021-08-27 NOTE — ED Notes (Signed)
Pt was given crackers and ginger ale for PO chanllenge. Pt tolerated well with no c/o of nausea or emesis.  ?

## 2021-08-28 ENCOUNTER — Telehealth: Payer: Self-pay

## 2021-08-28 NOTE — Telephone Encounter (Signed)
Transition Care Management Unsuccessful Follow-up Telephone Call ? ?Date of discharge and from where:  08/27/2021-[facility] ? ?Attempts:  1st Attempt ? ?Reason for unsuccessful TCM follow-up call:  Left voice message ? ?  ?

## 2021-08-29 NOTE — Telephone Encounter (Signed)
Transition Care Management Unsuccessful Follow-up Telephone Call ? ?Date of discharge and from where:  08/27/2021-[facility] ? ?Attempts:  2nd Attempt ? ?Reason for unsuccessful TCM follow-up call:  Left voice message ? ?  ?

## 2021-08-30 ENCOUNTER — Emergency Department: Payer: Medicaid Other

## 2021-08-30 ENCOUNTER — Emergency Department
Admission: EM | Admit: 2021-08-30 | Discharge: 2021-08-30 | Disposition: A | Discharge status: Home / Self Care | Payer: Medicaid Other | Attending: Emergency Medicine | Admitting: Emergency Medicine

## 2021-08-30 ENCOUNTER — Other Ambulatory Visit: Payer: Self-pay

## 2021-08-30 DIAGNOSIS — K529 Noninfective gastroenteritis and colitis, unspecified: Secondary | ICD-10-CM | POA: Diagnosis not present

## 2021-08-30 DIAGNOSIS — R197 Diarrhea, unspecified: Secondary | ICD-10-CM | POA: Diagnosis not present

## 2021-08-30 DIAGNOSIS — D72829 Elevated white blood cell count, unspecified: Secondary | ICD-10-CM | POA: Diagnosis not present

## 2021-08-30 DIAGNOSIS — R079 Chest pain, unspecified: Secondary | ICD-10-CM | POA: Insufficient documentation

## 2021-08-30 DIAGNOSIS — R1084 Generalized abdominal pain: Secondary | ICD-10-CM | POA: Diagnosis present

## 2021-08-30 DIAGNOSIS — R0789 Other chest pain: Secondary | ICD-10-CM | POA: Diagnosis not present

## 2021-08-30 DIAGNOSIS — R1110 Vomiting, unspecified: Secondary | ICD-10-CM | POA: Diagnosis not present

## 2021-08-30 DIAGNOSIS — R1011 Right upper quadrant pain: Secondary | ICD-10-CM | POA: Diagnosis not present

## 2021-08-30 DIAGNOSIS — R059 Cough, unspecified: Secondary | ICD-10-CM | POA: Diagnosis not present

## 2021-08-30 DIAGNOSIS — Z20822 Contact with and (suspected) exposure to covid-19: Secondary | ICD-10-CM | POA: Insufficient documentation

## 2021-08-30 LAB — URINALYSIS, ROUTINE W REFLEX MICROSCOPIC
Bilirubin Urine: NEGATIVE
Glucose, UA: NEGATIVE mg/dL
Hgb urine dipstick: NEGATIVE
Ketones, ur: 80 mg/dL — AB
Leukocytes,Ua: NEGATIVE
Nitrite: NEGATIVE
Protein, ur: NEGATIVE mg/dL
Specific Gravity, Urine: 1.021 (ref 1.005–1.030)
pH: 6 (ref 5.0–8.0)

## 2021-08-30 LAB — TROPONIN I (HIGH SENSITIVITY): Troponin I (High Sensitivity): 6 ng/L (ref <18)

## 2021-08-30 LAB — BASIC METABOLIC PANEL
Anion gap: 6 (ref 5–15)
BUN: 13 mg/dL (ref 6–20)
CO2: 28 mmol/L (ref 22–32)
Calcium: 9.2 mg/dL (ref 8.9–10.3)
Chloride: 104 mmol/L (ref 98–111)
Creatinine, Ser: 0.88 mg/dL (ref 0.61–1.24)
GFR, Estimated: >60 mL/min (ref >60)
Glucose, Bld: 107 mg/dL — ABNORMAL HIGH (ref 70–99)
Potassium: 3.9 mmol/L (ref 3.5–5.1)
Sodium: 138 mmol/L (ref 135–145)

## 2021-08-30 LAB — LIPASE, BLOOD: Lipase: 24 U/L (ref 11–51)

## 2021-08-30 LAB — CBC
HCT: 45.9 % (ref 39.0–52.0)
Hemoglobin: 15.2 g/dL (ref 13.0–17.0)
MCH: 28.7 pg (ref 26.0–34.0)
MCHC: 33.1 g/dL (ref 30.0–36.0)
MCV: 86.6 fL (ref 80.0–100.0)
Platelets: 332 10*3/uL (ref 150–400)
RBC: 5.3 MIL/uL (ref 4.22–5.81)
RDW: 11.5 % (ref 11.5–15.5)
WBC: 11.1 10*3/uL — ABNORMAL HIGH (ref 4.0–10.5)
nRBC: 0 % (ref 0.0–0.2)

## 2021-08-30 LAB — RESP PANEL BY RT-PCR (FLU A&B, COVID) ARPGX2
Influenza A by PCR: NEGATIVE
Influenza B by PCR: NEGATIVE
SARS Coronavirus 2 by RT PCR: NEGATIVE

## 2021-08-30 LAB — HEPATIC FUNCTION PANEL
ALT: 28 U/L (ref 0–44)
AST: 21 U/L (ref 15–41)
Albumin: 4.3 g/dL (ref 3.5–5.0)
Alkaline Phosphatase: 79 U/L (ref 38–126)
Bilirubin, Direct: <0.1 mg/dL (ref 0.0–0.2)
Total Bilirubin: 0.8 mg/dL (ref 0.3–1.2)
Total Protein: 8.3 g/dL — ABNORMAL HIGH (ref 6.5–8.1)

## 2021-08-30 MED ORDER — MORPHINE SULFATE (PF) 4 MG/ML IV SOLN
4.0000 mg | Freq: Once | INTRAVENOUS | Status: AC
  Administered 2021-08-30: 4 mg via INTRAVENOUS
  Filled 2021-08-30: qty 1

## 2021-08-30 MED ORDER — NAPROXEN 500 MG PO TABS: 500.0000 mg | ORAL_TABLET | Freq: Two times a day (BID) | ORAL | 0 refills | Status: AC

## 2021-08-30 MED ORDER — LACTATED RINGERS IV BOLUS
1000.0000 mL | Freq: Once | INTRAVENOUS | Status: AC
Start: 2021-08-30 — End: 2021-08-30
  Administered 2021-08-30: 1000 mL via INTRAVENOUS

## 2021-08-30 MED ORDER — ONDANSETRON HCL 4 MG/2ML IJ SOLN
4.0000 mg | Freq: Once | INTRAMUSCULAR | Status: AC
  Administered 2021-08-30: 4 mg via INTRAVENOUS
  Filled 2021-08-30: qty 2

## 2021-08-30 NOTE — ED Triage Notes (Signed)
Pt to ED via POV from home. Pt reports ongoing N/V/D, Cp and SOB that has been progressively getting worse since Saturday. Pt seen recently for same.  ?

## 2021-08-30 NOTE — ED Provider Notes (Signed)
? ?[facility] ?Provider Note ? ? ? Event Date/Time  ? First MD Initiated Contact with Patient 08/30/21 1144   ?  (approximate) ? ? ?History  ? ?Chief Complaint ?Abdominal Pain ? ?HPI ? ?Mitchell Michael is a 26 y.o. male with no significant past medical history presents to the ED complaining of abdominal pain.  Patient reports that he has been dealing with about 4 days of constant pain in his abdomen.  He describes the pain as sharp and diffuse, but most severe in the right upper quadrant in his epigastric area.  He has been feeling nauseous with multiple episodes of vomiting with this, also reports diarrhea but denies any blood in his emesis or stool.  He states the pain has begun to move up into his chest, particularly after he vomits.  He has felt short of breath at times but denies any current difficulty breathing.  He endorses a nonproductive cough but is not aware of any fevers or sick contacts.  He was seen in the ED for the symptoms a couple of days ago, when CT scan was most consistent with a gastroenteritis and he was prescribed Zofran.  He states he has not been able to pick up the Zofran prescription from the pharmacy. ?  ? ? ?Physical Exam  ? ?Triage Vital Signs: ?ED Triage Vitals  ?Enc Vitals Group  ?   BP 08/30/21 1116 122/66  ?   Pulse Rate 08/30/21 1116 67  ?   Resp 08/30/21 1116 20  ?   Temp 08/30/21 1116 98.1 ?F (36.7 ?C)  ?   Temp Source 08/30/21 1116 Oral  ?   SpO2 08/30/21 1116 100 %  ?   Weight 08/30/21 1120 180 lb (81.6 kg)  ?   Height 08/30/21 1120 5\' 6"  (1.676 m)  ?   Head Circumference --   ?   Peak Flow --   ?   Pain Score --   ?   Pain Loc --   ?   Pain Edu? --   ?   Excl. in [place]? --   ? ? ?Most recent vital signs: ?Vitals:  ? 08/30/21 1330 08/30/21 1400  ?BP: 123/77 117/72  ?Pulse: 60 64  ?Resp:  18  ?Temp:    ?SpO2: 96% 99%  ? ? ?Constitutional: Alert and oriented. ?Eyes: Conjunctivae are normal. ?Head: Atraumatic. ?Nose: No congestion/rhinnorhea. ?Mouth/Throat:  Mucous membranes are moist.  ?Cardiovascular: Normal rate, regular rhythm. Grossly normal heart sounds.  2+ radial pulses bilaterally. ?Respiratory: Normal respiratory effort.  No retractions. Lungs CTAB. ?Gastrointestinal: Soft and diffusely tender to palpation, greatest in the right upper quadrant and epigastrium with no rebound or guarding. No distention. ?Musculoskeletal: No lower extremity tenderness nor edema.  ?Neurologic:  Normal speech and language. No gross focal neurologic deficits are appreciated. ? ? ? ?ED Results / Procedures / Treatments  ? ?Labs ?(all labs ordered are listed, but only abnormal results are displayed) ?Labs Reviewed  ?BASIC METABOLIC PANEL - Abnormal; Notable for the following components:  ?    Result Value  ? Glucose, Bld 107 (*)   ? All other components within normal limits  ?CBC - Abnormal; Notable for the following components:  ? WBC 11.1 (*)   ? All other components within normal limits  ?HEPATIC FUNCTION PANEL - Abnormal; Notable for the following components:  ? Total Protein 8.3 (*)   ? All other components within normal limits  ?URINALYSIS, ROUTINE W REFLEX MICROSCOPIC - Abnormal; Notable  for the following components:  ? Color, Urine YELLOW (*)   ? APPearance CLEAR (*)   ? Ketones, ur 80 (*)   ? All other components within normal limits  ?RESP PANEL BY RT-PCR (FLU A&B, COVID) [id number]  ?LIPASE, BLOOD  ?TROPONIN I (HIGH SENSITIVITY)  ? ? ? ?EKG ? ?ED ECG REPORT ?Harriet Masson, the attending physician, personally viewed and interpreted this ECG. ? ? Date: 08/30/2021 ? EKG Time: 11:18 ? Rate: 67 ? Rhythm: normal sinus rhythm ? Axis: Normal ? Intervals:none ? ST&T Change: Nonspecific T wave changes ? ?RADIOLOGY ?Chest x-ray reviewed by me with no infiltrate, edema, or effusion. ? ?PROCEDURES: ? ?Critical Care performed: No ? ?Procedures ? ? ?MEDICATIONS ORDERED IN ED: ?Medications  ?lactated ringers bolus 1,000 mL (0 mLs Intravenous Stopped 08/30/21 1344)  ?morphine (PF) 4 MG/ML  injection 4 mg (4 mg Intravenous Given 08/30/21 1231)  ?ondansetron [facility]) injection 4 mg (4 mg Intravenous Given 08/30/21 1231)  ? ? ? ?IMPRESSION / MDM / ASSESSMENT AND PLAN / ED COURSE  ?I reviewed the triage vital signs and the nursing notes. ?             ?               ? ?26 y.o. male with no significant past medical history who presents to the ED complaining of 4 days of consistent abdominal pain, greatest in the right upper quadrant, associated with nausea and vomiting and moving upwards into his chest with intermittent difficulty breathing. ? ?Differential diagnosis includes, but is not limited to, ACS, PE, pneumonia, pneumothorax, cholecystitis, biliary colic, hepatitis, pancreatitis, gastroenteritis, dehydration, electrolyte abnormality, and UTI. ? ?Patient is uncomfortable appearing but nontoxic and in no acute distress, currently denies any difficulty breathing and is maintaining O2 sats at 100% on room air.  EKG shows no evidence of arrhythmia or ischemia and initial troponin is negative.  Given chest pain occurs primarily with vomiting, suspect it is related to esophagitis rather than ACS.  I doubt PE as he is PERC negative, intra-abdominal source of his symptoms seems much more likely.  CT scan from prior ED visit was reviewed and most consistent with a gastroenteritis, but given his right upper quadrant pain, we will check right upper quadrant ultrasound.  CBC shows mild leukocytosis with no anemia, BMP without AKI or electrolyte abnormality.  LFTs and lipase are pending, we will treat symptomatically with IV morphine and Zofran, hydrate with IV fluids. ? ?LFTs and lipase are within normal limits, patient feeling much better on reassessment and was able to tolerate water and crackers without difficulty.  Symptoms consistent with ongoing gastroenteritis and he is appropriate for discharge home with outpatient follow-up.  He was encouraged to pick up the Zofran from the pharmacy and we will add on  naproxen to take as needed for pain.  He was counseled to return to the ED for new worsening symptoms, patient agrees with plan. ? ?  ? ? ?FINAL CLINICAL IMPRESSION(S) / ED DIAGNOSES  ? ?Final diagnoses:  ?Gastroenteritis  ?Nonspecific chest pain  ?Vomiting and diarrhea  ? ? ? ?Rx / DC Orders  ? ?ED Discharge Orders   ? ?      Ordered  ?  naproxen (NAPROSYN) 500 MG tablet  2 times daily with meals       ? 08/30/21 1551  ? ?  ?  ? ?  ? ? ? ?Note:  This document was prepared using [employer]  software and may include unintentional dictation errors. ?  ?Chesley Noon, MD ?08/30/21 1554 ? ?

## 2021-08-30 NOTE — Telephone Encounter (Signed)
Transition Care Management Unsuccessful Follow-up Telephone Call ? ?Date of discharge and from where:  08/27/2021-[facility]  ? ?Attempts:  3rd Attempt ? ?Reason for unsuccessful TCM follow-up call:  Left voice message ? ?  ?

## 2021-08-31 ENCOUNTER — Telehealth: Payer: Self-pay

## 2021-08-31 NOTE — Telephone Encounter (Signed)
Transition Care Management Follow-up Telephone Call ?Date of discharge and from where: 08/30/2021-[facility] ?How have you been since you were released from the hospital? Pt stated he is doing good.  ?Any questions or concerns? No ? ?Items Reviewed: ?Did the pt receive and understand the discharge instructions provided? Yes  ?Medications obtained and verified? Yes  ?Other? No  ?Any new allergies since your discharge? No  ?Dietary orders reviewed? No ?Do you have support at home? Yes  ? ?Home Care and Equipment/Supplies: ?Were home health services ordered? not applicable ?If so, what is the name of the agency? N/A  ?Has the agency set up a time to come to the patient's home? not applicable ?Were any new equipment or medical supplies ordered?  No ?What is the name of the medical supply agency? N/A ?Were you able to get the supplies/equipment? not applicable ?Do you have any questions related to the use of the equipment or supplies? No ? ?Functional Questionnaire: (I = Independent and D = Dependent) ?ADLs: I ? ?Bathing/Dressing- I ? ?Meal Prep- I ? ?Eating- I ? ?Maintaining continence- I ? ?Transferring/Ambulation- I ? ?Managing Meds- I ? ?Follow up appointments reviewed: ? ?PCP Hospital f/u appt confirmed? No   ?Specialist Hospital f/u appt confirmed? No   ?Are transportation arrangements needed? No  ?If their condition worsens, is the pt aware to call PCP or go to the Emergency Dept.? Yes ?Was the patient provided with contact information for the PCP's office or ED? Yes ?Was to pt encouraged to call back with questions or concerns? Yes  ?

## 2022-01-22 ENCOUNTER — Ambulatory Visit (INDEPENDENT_AMBULATORY_CARE_PROVIDER_SITE_OTHER): Payer: Medicaid Other

## 2022-01-22 ENCOUNTER — Ambulatory Visit
Admission: EM | Admit: 2022-01-22 | Discharge: 2022-01-22 | Disposition: A | Discharge status: Home / Self Care | Payer: Medicaid Other | Attending: Emergency Medicine | Admitting: Emergency Medicine

## 2022-01-22 DIAGNOSIS — R2242 Localized swelling, mass and lump, left lower limb: Secondary | ICD-10-CM | POA: Diagnosis not present

## 2022-01-22 DIAGNOSIS — M79672 Pain in left foot: Secondary | ICD-10-CM | POA: Diagnosis not present

## 2022-01-22 MED ORDER — DOXYCYCLINE HYCLATE 100 MG PO CAPS: 100.0000 mg | ORAL_CAPSULE | Freq: Two times a day (BID) | ORAL | 0 refills | Status: DC

## 2022-01-22 NOTE — ED Triage Notes (Signed)
Pt report left foot pain, states split in between 4th and 5th digits and swelling started.   Pt states he dropped a roll of wrap on it a month ago and have been having pain in left foot.

## 2022-01-22 NOTE — ED Provider Notes (Signed)
[facility]    CSN: [id number] Arrival date & time: 01/22/22  0955      History   Chief Complaint No chief complaint on file.   HPI Mitchell Michael is a 26 y.o. male.   HPI  26 year old male here for evaluation of left foot swelling.  Patient reports that he has been experiencing swelling in his left foot for the past week.  This is not associated with any known injury.  He does endorse that 1 month ago he dropped a 30 pound roll of pallet wrap on his foot while wearing steel toed boots but it hit above where the area of swelling is.  He did not have any issues at that time and he denies any new injuries.  He denies any bruising, numbness, or tingling in his toes.  He states it hurts when he bears weight.  Additionally, his mom informed me that he has a split between his fourth and fifth toe that has been causing him some pain.  He has not had any fevers and there is no drainage.  Past Medical History:  Diagnosis Date   Allergy    Eczema     Patient Active Problem List   Diagnosis Date Noted   History of anemia 08/25/2015   Allergy    Eczema     Past Surgical History:  Procedure Laterality Date   cyst removed Left        Home Medications    Prior to Admission medications   Medication Sig Start Date End Date Taking? Authorizing Provider  doxycycline (VIBRAMYCIN) 100 MG capsule Take 1 capsule (100 mg total) by mouth 2 (two) times daily. 01/22/22  Yes Becky Augusta, NP  fluticasone (FLONASE) 50 MCG/ACT nasal spray Place 2 sprays into both nostrils daily. 01/21/20   Moshe Cipro, NP    Family History Family History  Problem Relation Age of Onset   Hypertension Mother    Hypertension Sister    Hypertension Maternal Grandmother    Heart murmur Maternal Grandmother    Diabetes Maternal Grandfather    Hypertension Maternal Grandfather    Thyroid disease Maternal Grandfather    Cancer Maternal Grandfather        Brain Tumor    Social History Social  History   Tobacco Use   Smoking status: Never   Smokeless tobacco: Never  Vaping Use   Vaping Use: Former  Substance Use Topics   Alcohol use: No   Drug use: No     Allergies   Patient has no known allergies.   Review of Systems Review of Systems  Constitutional:  Negative for fever.  Musculoskeletal:  Positive for myalgias. Negative for arthralgias.  Skin:  Positive for color change and wound.  Hematological: Negative.      Physical Exam Triage Vital Signs ED Triage Vitals  Enc Vitals Group     BP 01/22/22 1215 136/79     Pulse Rate 01/22/22 1215 61     Resp 01/22/22 1215 20     Temp 01/22/22 1215 98.1 F (36.7 C)     Temp Source 01/22/22 1215 Oral     SpO2 01/22/22 1215 100 %     Weight 01/22/22 1216 185 lb (83.9 kg)     Height 01/22/22 1216 5\' 7"  (1.702 m)     Head Circumference --      Peak Flow --      Pain Score 01/22/22 1222 8     Pain Loc --  Pain Edu? --      Excl. in [place]? --    No data found.  Updated Vital Signs BP 136/79 (BP Location: Left Arm)   Pulse 61   Temp 98.1 F (36.7 C) (Oral)   Resp 20   Ht 5\' 7"  (1.702 m)   Wt 185 lb (83.9 kg)   SpO2 100%   BMI 28.98 kg/m   Visual Acuity Right Eye Distance:   Left Eye Distance:   Bilateral Distance:    Right Eye Near:   Left Eye Near:    Bilateral Near:     Physical Exam Vitals and nursing note reviewed.  Constitutional:      Appearance: Normal appearance. He is not ill-appearing.  HENT:     Head: Normocephalic and atraumatic.  Musculoskeletal:        General: Swelling and tenderness present. No deformity or signs of injury. Normal range of motion.  Skin:    General: Skin is warm and dry.     Capillary Refill: Capillary refill takes less than 2 seconds.     Findings: Erythema present.  Neurological:     General: No focal deficit present.     Mental Status: He is alert and oriented to person, place, and time.  Psychiatric:        Mood and Affect: Mood normal.         Behavior: Behavior normal.        Thought Content: Thought content normal.        Judgment: Judgment normal.      UC Treatments / Results  Labs (all labs ordered are listed, but only abnormal results are displayed) Labs Reviewed - No data to display  EKG   Radiology DG Foot Complete Left  Result Date: 01/22/2022 CLINICAL DATA:  Acute left foot pain after injury 1 month ago. EXAM: LEFT FOOT - COMPLETE 3+ VIEW COMPARISON:  None Available. FINDINGS: There is no evidence of fracture or dislocation. There is no evidence of arthropathy or other focal bone abnormality. Soft tissues are unremarkable. IMPRESSION: Negative. Electronically Signed   By: Marijo Conception M.D.   On: 01/22/2022 13:11    Procedures Procedures (including critical care time)  Medications Ordered in UC Medications - No data to display  Initial Impression / Assessment and Plan / UC Course  I have reviewed the triage vital signs and the nursing notes.  Pertinent labs & imaging results that were available during my care of the patient were reviewed by me and considered in my medical decision making (see chart for details).   Mitchell Michael old male here for evaluation of swelling and redness to his left foot that has been going on for the past week.    The area is swollen and tender to palpation.  It is also erythematous but it is free of warmth.  His DP PT pulses are 2+.  He has no pain with palpation of the proximal midfoot, ankle joint, calcaneus, or the sole of his foot.  He has full sensation range of motion in his toes.  Patient does have a small split in the skin between the fourth and fifth toe that looks to be healing.  There is no erythema in the wound bed and there is no drainage.  The etiology of the patient's swelling is unclear.  I will obtain radiograph to look for bony abnormality given the relatively recent trauma.  Radiology impression states no evidence of fracture or dislocation.  There is no evidence  of  arthropathy or focal bone abnormality.  Soft tissues are unremarkable.  Negative exam.  Etiology of the patient's swelling and redness is unclear.  I do not suspect cellulitis as the area is not hot to touch.  However, given that patient has a split in his toe I will cover him for potential cellulitis with doxycycline twice daily for 10 days.  I have him use over-the-counter ibuprofen and Tylenol as needed for fever and pain.  Elevation of his foot to help with swelling.  Work note provided.   Final Clinical Impressions(s) / UC Diagnoses   Final diagnoses:  Localized swelling of left foot     Discharge Instructions      Your x-rays did not demonstrate any bony abnormalities such as broken bones or dislocated bones.  The redness and swelling to your foot is unclear.  Given the fact that you do have a break in your skin between your fourth and fifth toe I will treat you for presumptive developing cellulitis with doxycycline twice daily for 10 days.  Take this with food.  Take the Doxycycline twice daily with food for 10 days.  Doxycycline will make you more sensitive to sunburn so wear sunscreen when outdoors and reapply it every 90 minutes.  Apply warm compresses to help promote drainage.  Use OTC Tylenol and Ibuprofen according to the package instructions as needed for pain.  Return for new or worsening symptoms.       ED Prescriptions     Medication Sig Dispense Auth. Provider   doxycycline (VIBRAMYCIN) 100 MG capsule Take 1 capsule (100 mg total) by mouth 2 (two) times daily. 20 capsule Becky Augusta, NP      PDMP not reviewed this encounter.   Becky Augusta, NP 01/22/22 [phone]

## 2022-01-22 NOTE — Discharge Instructions (Signed)
Your x-rays did not demonstrate any bony abnormalities such as broken bones or dislocated bones.  The redness and swelling to your foot is unclear.  Given the fact that you do have a break in your skin between your fourth and fifth toe I will treat you for presumptive developing cellulitis with doxycycline twice daily for 10 days.  Take this with food.  Take the Doxycycline twice daily with food for 10 days.  Doxycycline will make you more sensitive to sunburn so wear sunscreen when outdoors and reapply it every 90 minutes.  Apply warm compresses to help promote drainage.  Use OTC Tylenol and Ibuprofen according to the package instructions as needed for pain.  Return for new or worsening symptoms.

## 2022-01-29 ENCOUNTER — Ambulatory Visit: Payer: Self-pay

## 2022-01-29 ENCOUNTER — Emergency Department
Admission: EM | Admit: 2022-01-29 | Discharge: 2022-01-29 | Disposition: A | Discharge status: Home / Self Care | Payer: Medicaid Other | Attending: Emergency Medicine | Admitting: Emergency Medicine

## 2022-01-29 ENCOUNTER — Encounter: Payer: Self-pay | Admitting: Medical Oncology

## 2022-01-29 ENCOUNTER — Other Ambulatory Visit: Payer: Self-pay

## 2022-01-29 DIAGNOSIS — U071 COVID-19: Secondary | ICD-10-CM | POA: Diagnosis not present

## 2022-01-29 DIAGNOSIS — R519 Headache, unspecified: Secondary | ICD-10-CM | POA: Diagnosis present

## 2022-01-29 LAB — SARS CORONAVIRUS 2 BY RT PCR: SARS Coronavirus 2 by RT PCR: POSITIVE — AB

## 2022-01-29 MED ORDER — BENZONATATE 200 MG PO CAPS: 200.0000 mg | ORAL_CAPSULE | Freq: Three times a day (TID) | ORAL | 0 refills | Status: AC | PRN

## 2022-01-29 NOTE — ED Notes (Signed)
See triage note  Presents with h/a and body aches   States his son developed sx's Saturday   He developed sx's yesterday

## 2022-01-29 NOTE — ED Provider Notes (Signed)
[facility] Provider Note    Event Date/Time   First MD Initiated Contact with Patient 01/29/22 1404     (approximate)   History   URI and Headache   HPI  Mitchell Michael is a 26 y.o. male   presents to the ED with other family members who got sick over the weekend with upper respiratory symptoms.  Patient has a not been COVID vaccinated.  He denies any nausea, vomiting or diarrhea.      Physical Exam   Triage Vital Signs: ED Triage Vitals  Enc Vitals Group     BP 01/29/22 1425 118/66     Pulse Rate 01/29/22 1425 78     Resp 01/29/22 1425 16     Temp 01/29/22 1425 100.1 F (37.8 C)     Temp Source 01/29/22 1425 Oral     SpO2 01/29/22 1425 98 %     Weight 01/29/22 1341 182 lb 15.7 oz (83 kg)     Height 01/29/22 1341 5\' 7"  (1.702 m)     Head Circumference --      Peak Flow --      Pain Score 01/29/22 1341 4     Pain Loc --      Pain Edu? --      Excl. in GC? --     Most recent vital signs: Vitals:   01/29/22 1425 01/29/22 1551  BP: 118/66 120/70  Pulse: 78 80  Resp: 16 16  Temp: 100.1 F (37.8 C)   SpO2: 98% 98%     General: Awake, no distress.  CV:  Good peripheral perfusion.  Resp:  Normal effort.  Lungs are clear bilaterally. Abd:  No distention.  Other:     ED Results / Procedures / Treatments   Labs (all labs ordered are listed, but only abnormal results are displayed) Labs Reviewed  SARS CORONAVIRUS 2 BY RT PCR - Abnormal; Notable for the following components:      Result Value   SARS Coronavirus 2 by RT PCR POSITIVE (*)    All other components within normal limits      PROCEDURES:  Critical Care performed:   Procedures   MEDICATIONS ORDERED IN ED: Medications - No data to display   IMPRESSION / MDM / ASSESSMENT AND PLAN / ED COURSE  I reviewed the triage vital signs and the nursing notes.   Differential diagnosis includes, but is not limited to, COVID, influenza, viral illness.  26 year old male  presents to the ED with his family with upper respiratory symptoms that began over the weekend.  Patient has not been COVID vaccinated.  He was made aware that his COVID test is positive as well as the remainder of the family.  He is encouraged to stay hydrated and was told to continue with Tylenol or ibuprofen as needed for fever, headache or body aches.  A work note was written for him to remain out of work and instructions for quarantine with his family.  He is to return to the emergency department immediately should he develop any shortness of breath or difficulty breathing.      Patient's presentation is most consistent with acute complicated illness / injury requiring diagnostic workup.  FINAL CLINICAL IMPRESSION(S) / ED DIAGNOSES   Final diagnoses:  COVID     Rx / DC Orders   ED Discharge Orders          Ordered    benzonatate (TESSALON) 200 MG capsule  3 times  daily PRN        01/29/22 1536             Note:  This document was prepared using Dragon voice recognition software and may include unintentional dictation errors.   Tommi Rumps, PA-C 01/29/22 1616    Merwyn Katos, MD 01/30/22 [phone]

## 2022-01-29 NOTE — ED Triage Notes (Signed)
Pt reports that he has been having covid like sx's x 3 days, family also experiencing same sx's.

## 2022-01-29 NOTE — Discharge Instructions (Signed)
Follow-up with your primary care provider if any continued problems or concerns.  Return to the emergency department immediately if you develop any shortness of breath or difficulty breathing.  Drink lots of fluids to stay hydrated.  You may take Tylenol or ibuprofen if needed for body aches, fever or headache.  A prescription for Tessalon was sent to your pharmacy to take as needed for cough however with COVID it may or may not make a huge difference with your cough.

## 2022-04-03 DIAGNOSIS — H5213 Myopia, bilateral: Secondary | ICD-10-CM | POA: Diagnosis not present

## 2023-02-10 ENCOUNTER — Ambulatory Visit: Payer: Self-pay | Admitting: Family Medicine

## 2023-02-10 DIAGNOSIS — Z202 Contact with and (suspected) exposure to infections with a predominantly sexual mode of transmission: Secondary | ICD-10-CM

## 2023-02-10 MED ORDER — DOXYCYCLINE HYCLATE 100 MG PO TABS: 100.0000 mg | ORAL_TABLET | Freq: Two times a day (BID) | ORAL | Status: AC

## 2023-02-10 NOTE — Progress Notes (Signed)
Pt is here with contact to chlamydia card.  Pt is not having symptoms and would not like any other testing.  The patient was dispensed doxycycline today. I provided counseling today regarding the medication. We discussed the medication, the side effects and when to call clinic. Patient given the opportunity to ask questions. Questions answered.  Gaspar Garbe, RN

## 2023-02-13 NOTE — Progress Notes (Signed)
Attestation of Attending Supervision: Evaluation, management, and procedures were performed by standing order with my collaboration.  I have reviewed the  note and chart, and I agree with the management and plan.  Fayette Pho, MD Clinical Services Medical Director [facility] Department 02/13/23 10:12 PM

## 2023-05-26 ENCOUNTER — Ambulatory Visit: Payer: Medicaid Other

## 2023-10-02 IMAGING — CR DG CHEST 2V
1 series · 2 of 2 positions shown · non-contrast
Comparison: None.

CLINICAL DATA: Chest pain.  Shortness of breath.

EXAM:
CHEST - 2 VIEW

[Series 1: dg chest 2 view · 0.14mm/px · 2 of 2 slices shown]
[im 1/2]
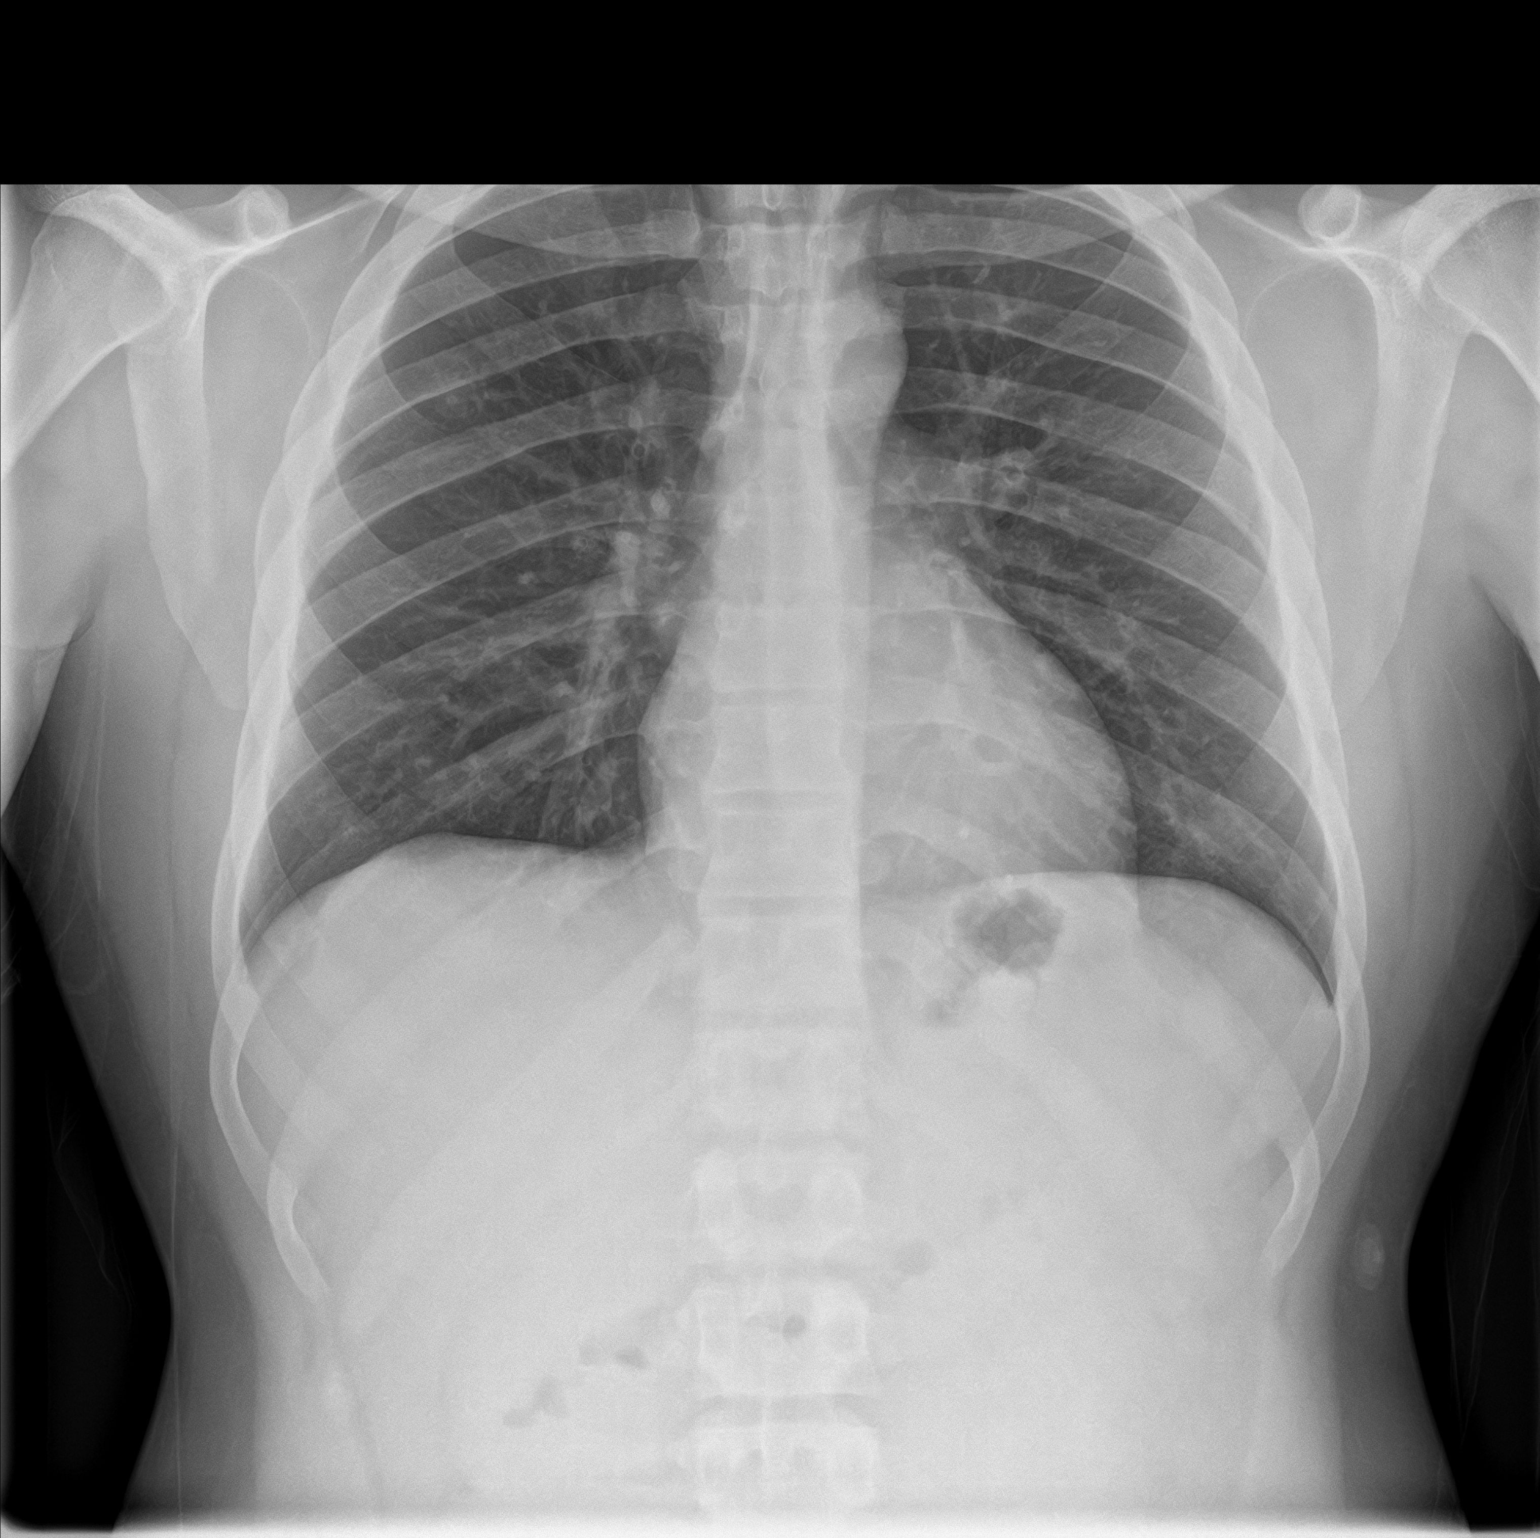
[im 2/2]
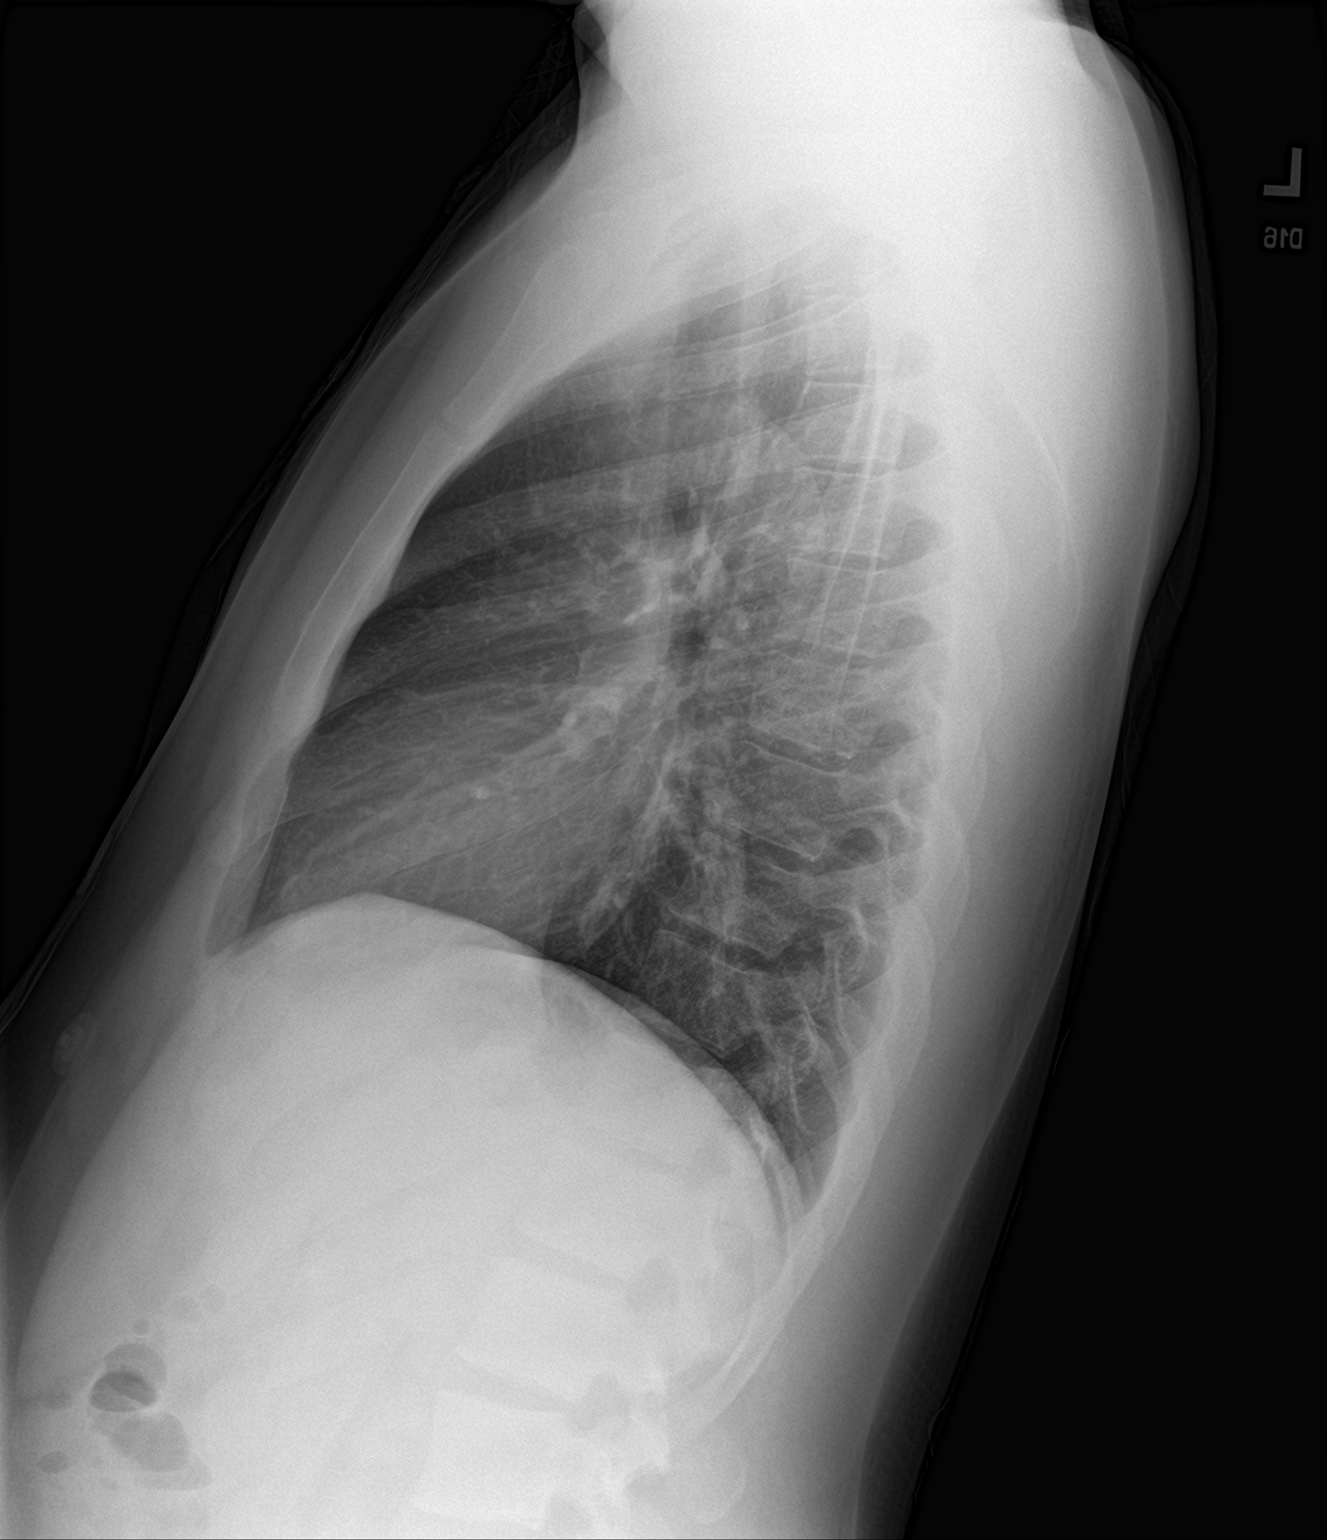

[2 of 2 positions shown; findings below may reference images not displayed]

FINDINGS: Cardiac silhouette and mediastinal contours are within normal
limits. The lungs are clear. No pleural effusion or pneumothorax. No
acute skeletal abnormality.
IMPRESSION: No active cardiopulmonary disease.

## 2023-10-02 IMAGING — US US ABDOMEN LIMITED
1 series · 14 of 25 positions shown · non-contrast
Comparison: None.

CLINICAL DATA: Right upper quadrant abdominal pain.

EXAM:
ULTRASOUND ABDOMEN LIMITED RIGHT UPPER QUADRANT

[Series 1: us abdomen limited ruq (liver/gb) · 14 of 54 slices shown]
[im 1/54]
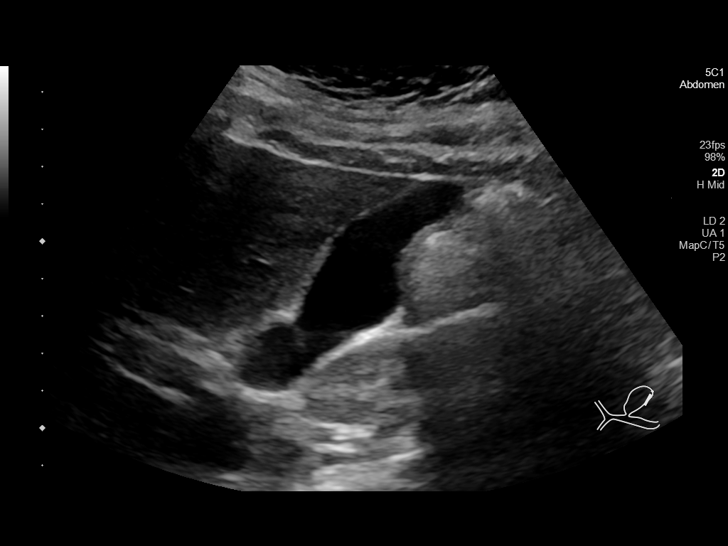
[im 5/54]
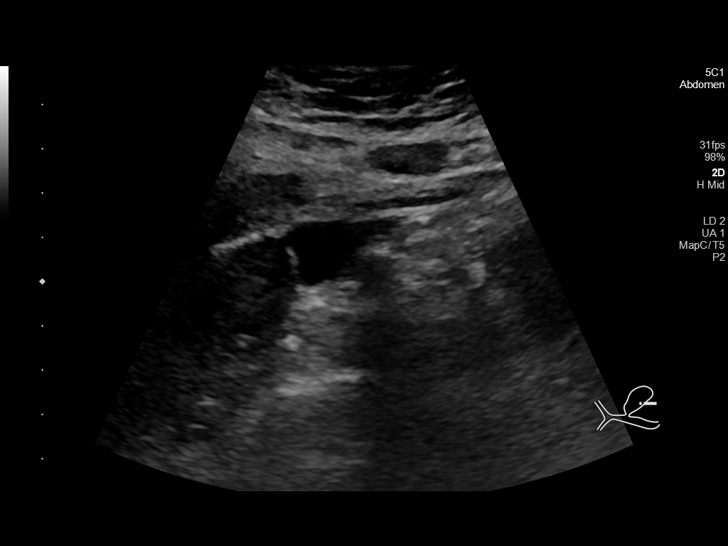
[im 9/54]
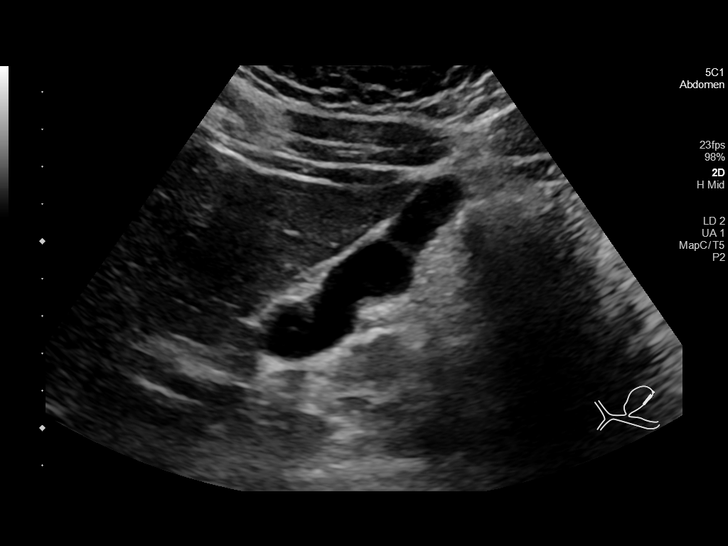
[im 14/54]
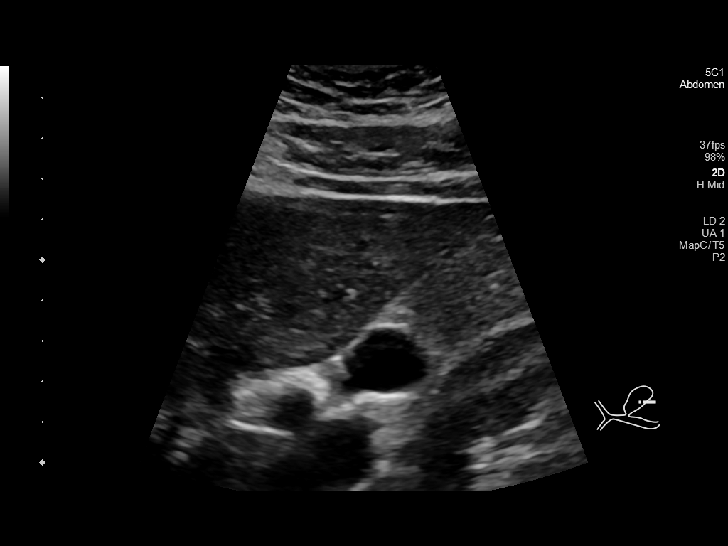
[im 18/54]
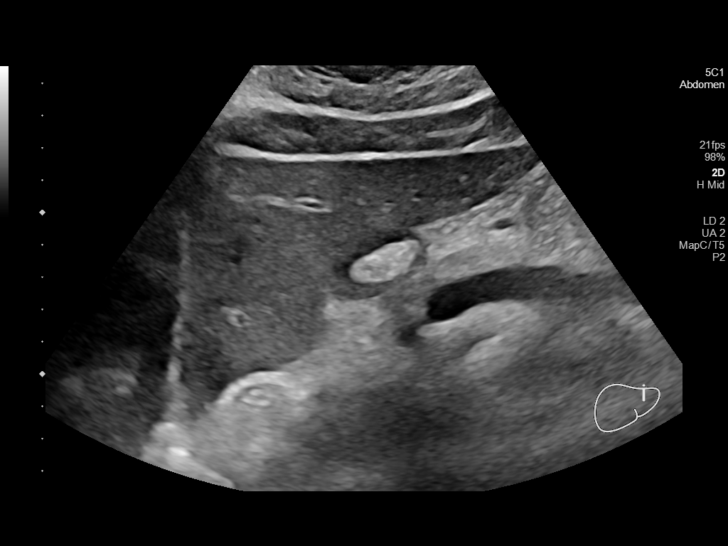
[im 20/54]
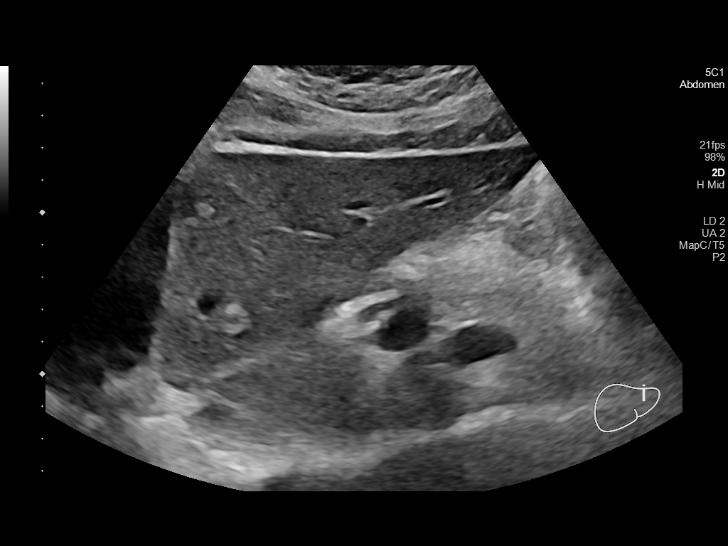
[im 25/54]
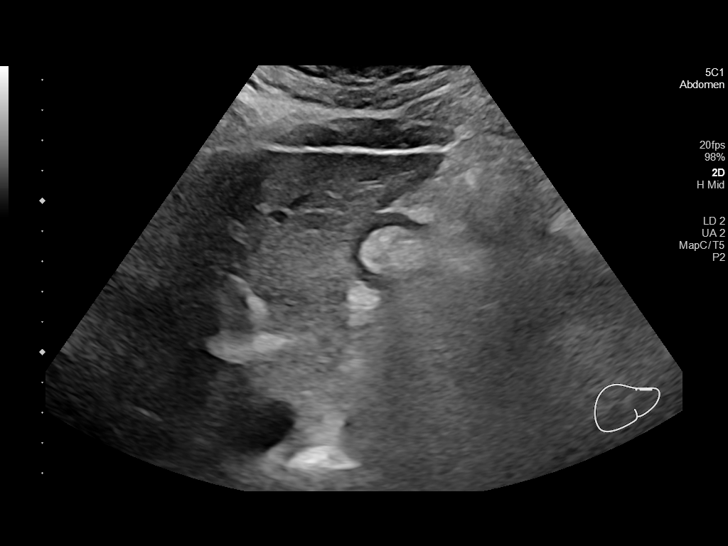
[im 29/54]
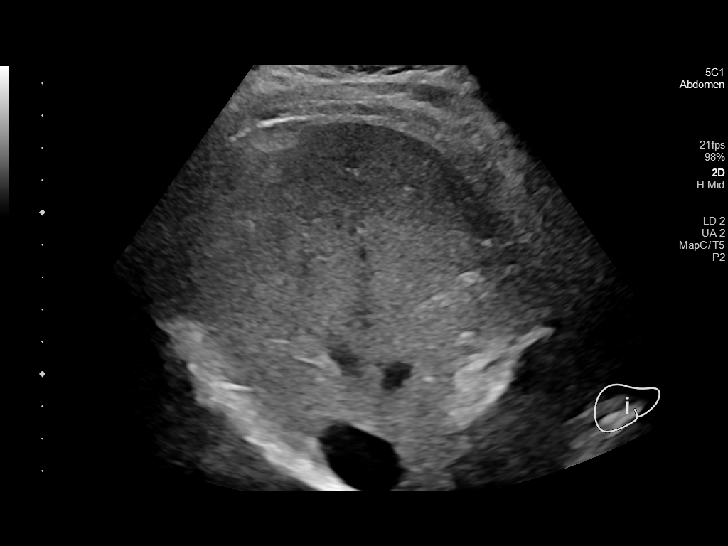
[im 34/54]
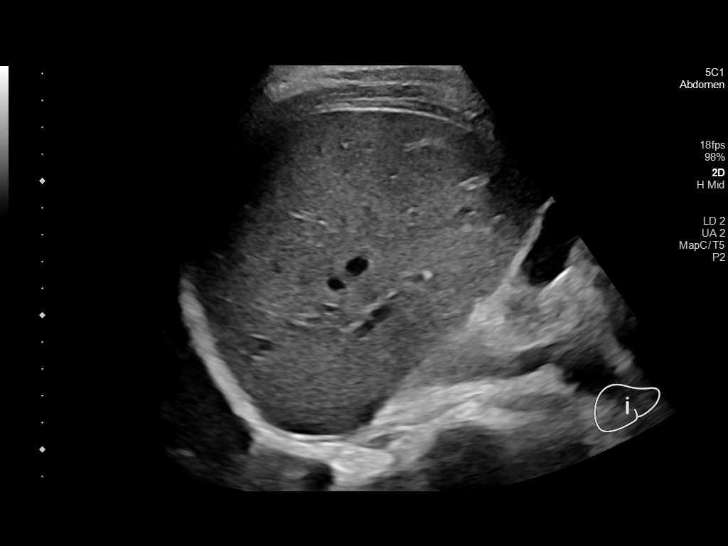
[im 36/54]
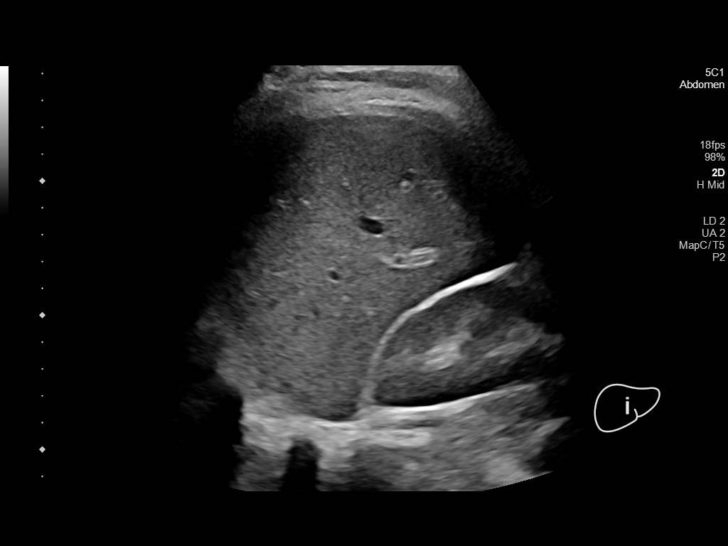
[im 40/54]
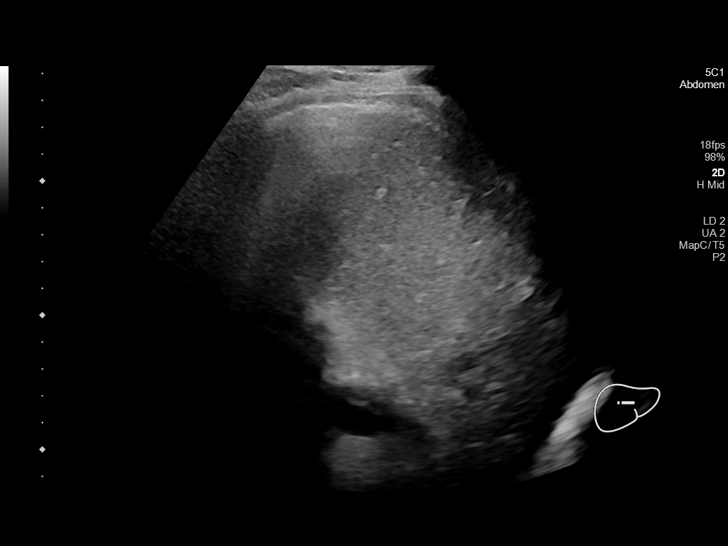
[im 45/54]
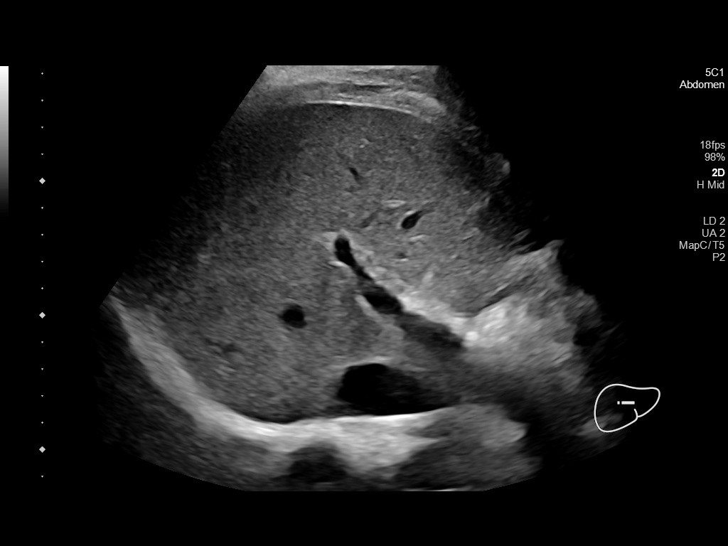
[im 49/54]
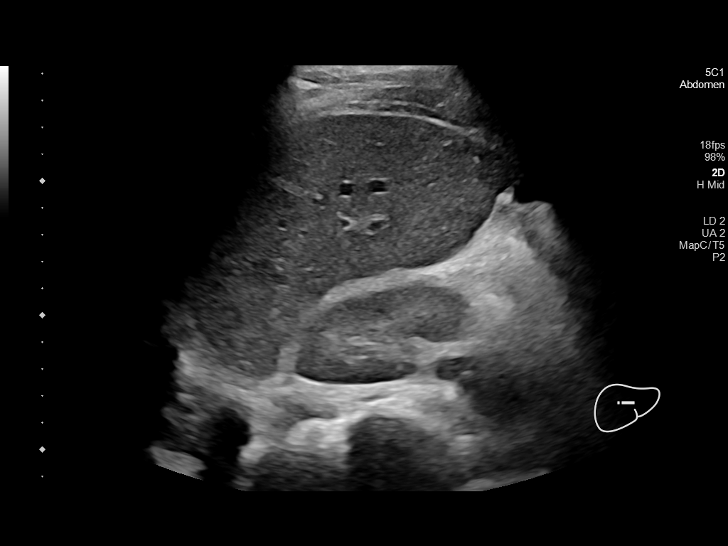
[im 54/54]
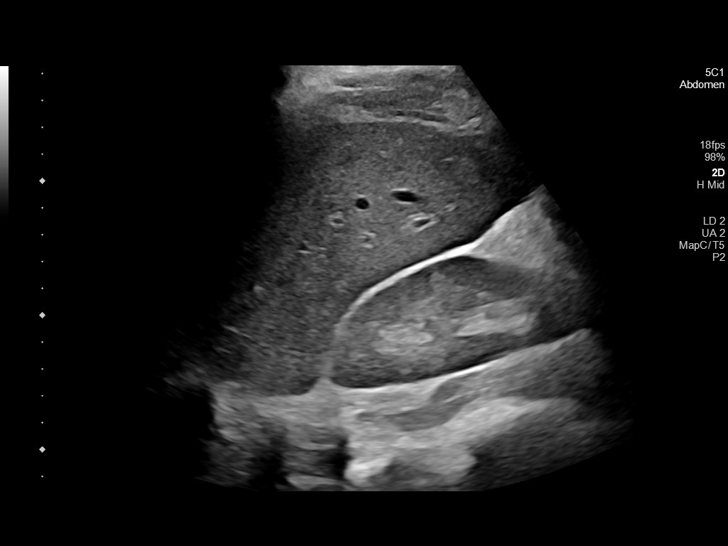

[14 of 25 positions shown; findings below may reference images not displayed]

FINDINGS: Gallbladder:

No gallstones or wall thickening visualized. No sonographic Murphy
sign noted by sonographer.

Common bile duct:

Diameter: 2 mm which is within normal limits.

Liver:

No focal lesion identified. Within normal limits in parenchymal
echogenicity. Portal vein is patent on color Doppler imaging with
normal direction of blood flow towards the liver.

Other: None.
IMPRESSION: No definite abnormality seen in the right upper quadrant of the
abdomen.

## 2023-10-16 ENCOUNTER — Ambulatory Visit

## 2023-11-06 ENCOUNTER — Ambulatory Visit
